# Patient Record
Sex: Male | Born: 1955
Health system: Southern US, Community
[De-identification: ages and names within clinical notes are randomized; demographics above are authoritative.]

## PROBLEM LIST (undated history)

## (undated) DIAGNOSIS — Z87891 Personal history of nicotine dependence: Secondary | ICD-10-CM

## (undated) DIAGNOSIS — J309 Allergic rhinitis, unspecified: Secondary | ICD-10-CM

## (undated) DIAGNOSIS — A539 Syphilis, unspecified: Secondary | ICD-10-CM

## (undated) DIAGNOSIS — K4090 Unilateral inguinal hernia, without obstruction or gangrene, not specified as recurrent: Secondary | ICD-10-CM

## (undated) DIAGNOSIS — Z8619 Personal history of other infectious and parasitic diseases: Secondary | ICD-10-CM

## (undated) HISTORY — DX: Personal history of other infectious and parasitic diseases: Z86.19

## (undated) HISTORY — DX: Allergic rhinitis, unspecified: J30.9

## (undated) HISTORY — DX: Personal history of nicotine dependence: Z87.891

## (undated) HISTORY — DX: Syphilis, unspecified: A53.9

---
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
## 1996-04-03 DIAGNOSIS — Z87891 Personal history of nicotine dependence: Secondary | ICD-10-CM

## 1996-04-03 HISTORY — DX: Personal history of nicotine dependence: Z87.891

## 2002-12-19 ENCOUNTER — Encounter: Payer: Self-pay | Admitting: Emergency Medicine

## 2002-12-19 ENCOUNTER — Emergency Department (HOSPITAL_COMMUNITY): Admission: EM | Admit: 2002-12-19 | Discharge: 2002-12-20 | Payer: Self-pay | Admitting: Emergency Medicine

## 2012-04-03 HISTORY — PX: DENTAL SURGERY: SHX609

## 2012-08-14 ENCOUNTER — Ambulatory Visit: Payer: Self-pay | Admitting: Family Medicine

## 2012-08-22 ENCOUNTER — Encounter: Payer: Self-pay | Admitting: Family Medicine

## 2012-10-09 ENCOUNTER — Ambulatory Visit (INDEPENDENT_AMBULATORY_CARE_PROVIDER_SITE_OTHER): Payer: 59 | Admitting: Family Medicine

## 2012-10-09 ENCOUNTER — Encounter: Payer: Self-pay | Admitting: Family Medicine

## 2012-10-09 VITALS — BP 122/82 | HR 64 | Temp 98.1°F | Ht 73.0 in | Wt 273.0 lb

## 2012-10-09 DIAGNOSIS — Z23 Encounter for immunization: Secondary | ICD-10-CM

## 2012-10-09 DIAGNOSIS — Z125 Encounter for screening for malignant neoplasm of prostate: Secondary | ICD-10-CM

## 2012-10-09 DIAGNOSIS — Z0001 Encounter for general adult medical examination with abnormal findings: Secondary | ICD-10-CM | POA: Insufficient documentation

## 2012-10-09 DIAGNOSIS — Z1211 Encounter for screening for malignant neoplasm of colon: Secondary | ICD-10-CM

## 2012-10-09 DIAGNOSIS — J019 Acute sinusitis, unspecified: Secondary | ICD-10-CM

## 2012-10-09 DIAGNOSIS — Z0000 Encounter for general adult medical examination without abnormal findings: Secondary | ICD-10-CM

## 2012-10-09 LAB — BASIC METABOLIC PANEL
BUN: 19 mg/dL (ref 6–23)
Calcium: 10.2 mg/dL (ref 8.4–10.5)
GFR: 93.01 mL/min (ref >60.00)
Potassium: 4.9 mEq/L (ref 3.5–5.1)
Sodium: 142 mEq/L (ref 135–145)

## 2012-10-09 LAB — LIPID PANEL
HDL: 54.8 mg/dL (ref >39.00)
Total CHOL/HDL Ratio: 3
Triglycerides: 82 mg/dL (ref 0.0–149.0)
VLDL: 16.4 mg/dL (ref 0.0–40.0)

## 2012-10-09 LAB — PSA: PSA: 0.48 ng/mL (ref 0.10–4.00)

## 2012-10-09 MED ORDER — AMOXICILLIN-POT CLAVULANATE 875-125 MG PO TABS: 1.0000 | ORAL_TABLET | Freq: Two times a day (BID) | ORAL | Status: AC

## 2012-10-09 NOTE — Assessment & Plan Note (Addendum)
Given duration of sxs, will treat with augmentin for possible persistent bacterial sinusitis Supportive care as per instructions

## 2012-10-09 NOTE — Assessment & Plan Note (Signed)
Preventative protocols reviewed and updated unless pt declined. Discussed healthy diet and lifestyle. Blood work today. iFOB and DRE/PSA today.

## 2012-10-09 NOTE — Progress Notes (Signed)
Subjective:    Patient ID: Jordan Reynolds, male    DOB: 08/28/1955, 57 y.o.   MRN: [id number]  HPI CC: new pt to establish  Several week h/o green discharge from nose, coughing clear mucous.  + HA and L ear pain. Using OTC meds such as pseudophed. Hoarse voice. Outdoor work.  Staying hydrated. No fevers/chills, abd pain  Preventative: No recent CPE or blood work Fasting today. Colon cancer screening - iFOB requested today. Prostate cancer screening - father with h/o prostate cancer - requests checked today. tetanus 2005 but unsure, requests today.  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  Past histories reviewed and updated if relevant as below. There are no active problems to display for this patient.  Past Medical History  Diagnosis Date  . History of chicken pox   . Allergic rhinitis   . Ex-smoker 1998   Past Surgical History  Procedure Laterality Date  . Tonsillectomy  1965  . Dental surgery  2014    implants   History  Substance Use Topics  . Smoking status: Former Games developer  . Smokeless tobacco: Never Used     Comment: quit October 1988  . Alcohol Use: No   Family History  Problem Relation Age of Onset  . Other Mother     benign brain tumor  . CAD Father     unsure, deceased  . Cancer Father 20    prostate  . Diabetes Mother     early  . Stroke Mother   . Hypertension Father   . Hyperlipidemia Mother    No Known Allergies No current outpatient prescriptions on file prior to visit.   No current facility-administered medications on file prior to visit.     Review of Systems  Constitutional: Negative for fever, chills, activity change, appetite change, fatigue and unexpected weight change (45 lb loss in 8-9 mo, trying - healthier  eating).  HENT: Positive for congestion, rhinorrhea, sneezing, voice change, postnasal drip and sinus pressure. Negative for hearing loss, sore throat and neck pain.   Eyes: Negative for visual disturbance.  Respiratory: Positive for cough. Negative for chest tightness, shortness of breath and wheezing.   Cardiovascular: Negative for chest pain, palpitations and leg swelling.  Gastrointestinal: Negative for nausea, vomiting, abdominal pain, diarrhea, constipation, blood in stool and abdominal distention.  Genitourinary: Negative for hematuria and difficulty urinating.  Musculoskeletal: Negative for myalgias and arthralgias.  Skin: Negative for rash.  Neurological: Negative for dizziness, seizures, syncope and headaches (sinusp ressure).  Hematological: Negative for adenopathy. Does not bruise/bleed easily.  Psychiatric/Behavioral: Negative for dysphoric mood. The patient is not nervous/anxious.        Objective:   Physical Exam  Nursing note and vitals reviewed. Constitutional: She is oriented to person, place, and time. She appears well-developed and well-nourished. No distress.  HENT:  Head: Normocephalic and atraumatic.  Right Ear: Hearing, tympanic membrane, external ear and ear canal normal.  Left Ear: Hearing, tympanic membrane, external ear and ear canal normal.  Nose: Nose normal. No mucosal edema or rhinorrhea. Right sinus exhibits no maxillary sinus tenderness and no frontal sinus tenderness. Left sinus exhibits no maxillary sinus tenderness and no frontal sinus tenderness.  Mouth/Throat: Uvula is midline, oropharynx is clear and moist and mucous membranes are normal. No oropharyngeal  exudate.  Eyes: Conjunctivae and EOM are normal. Pupils are equal, round, and reactive to light. No scleral icterus.  Neck: Normal range of motion. Neck supple. No JVD present. No thyromegaly present.  Cardiovascular: Normal rate, regular rhythm, normal heart sounds and intact distal pulses.   No  murmur heard. Pulses:      Radial pulses are 2+ on the right side, and 2+ on the left side.  Pulmonary/Chest: Effort normal and breath sounds normal. No respiratory distress. She has no wheezes. She has no rales.  Abdominal: Soft. Bowel sounds are normal. She exhibits no distension and no mass. There is no tenderness. There is no rebound and no guarding.  Genitourinary: Rectum normal. Rectal exam shows no external hemorrhoid, no internal hemorrhoid, no fissure, no mass, no tenderness and anal tone normal.  Prostate exam: smooth, not indurated, not enlarged, nontender. 20gm  Musculoskeletal: Normal range of motion. She exhibits no edema.  Lymphadenopathy:    She has no cervical adenopathy.  Neurological: She is alert and oriented to person, place, and time.  CN grossly intact, station and gait intact  Skin: Skin is warm and dry. No rash noted.  Psychiatric: She has a normal mood and affect. Her behavior is normal. Judgment and thought content normal.       Assessment & Plan:

## 2012-10-09 NOTE — Patient Instructions (Addendum)
Tdap today. Stool kit today - pass by lab. Blood work today. Good to meet you today, call us with quesitons.  You likely do have lingering sinus infection. Take medicine as prescribed: augmentin twice daily for 10 days Push fluids and plenty of rest. Nasal saline irrigation or neti pot to help drain sinuses. May use simple mucinex with plenty of fluid to help mobilize mucous. Let us know if fever >101.5, trouble opening/closing mouth, difficulty swallowing, or worsening - you may need to be seen again.

## 2012-10-09 NOTE — Addendum Note (Signed)
Addended by: Sydell Axon C on: 10/09/2012 11:34 AM   Modules accepted: Orders

## 2012-10-10 ENCOUNTER — Encounter: Payer: Self-pay | Admitting: *Deleted

## 2012-10-14 ENCOUNTER — Other Ambulatory Visit: Payer: 59

## 2012-10-14 ENCOUNTER — Encounter: Payer: Self-pay | Admitting: *Deleted

## 2012-10-14 DIAGNOSIS — Z1211 Encounter for screening for malignant neoplasm of colon: Secondary | ICD-10-CM

## 2013-09-23 ENCOUNTER — Other Ambulatory Visit: Payer: Self-pay | Admitting: Family Medicine

## 2013-09-23 DIAGNOSIS — Z0000 Encounter for general adult medical examination without abnormal findings: Secondary | ICD-10-CM

## 2013-09-23 DIAGNOSIS — Z125 Encounter for screening for malignant neoplasm of prostate: Secondary | ICD-10-CM

## 2013-09-26 ENCOUNTER — Other Ambulatory Visit (INDEPENDENT_AMBULATORY_CARE_PROVIDER_SITE_OTHER): Payer: 59

## 2013-09-26 DIAGNOSIS — Z125 Encounter for screening for malignant neoplasm of prostate: Secondary | ICD-10-CM

## 2013-09-26 DIAGNOSIS — Z0000 Encounter for general adult medical examination without abnormal findings: Secondary | ICD-10-CM

## 2013-09-26 LAB — LIPID PANEL
CHOLESTEROL: 173 mg/dL (ref 0–200)
HDL: 45.8 mg/dL (ref >39.00)
LDL Cholesterol: 115 mg/dL — ABNORMAL HIGH (ref 0–99)
NonHDL: 127.2
Total CHOL/HDL Ratio: 4
Triglycerides: 63 mg/dL (ref 0.0–149.0)
VLDL: 12.6 mg/dL (ref 0.0–40.0)

## 2013-09-26 LAB — PSA: PSA: 0.56 ng/mL (ref 0.10–4.00)

## 2013-09-26 LAB — BASIC METABOLIC PANEL
BUN: 22 mg/dL (ref 6–23)
CALCIUM: 9.1 mg/dL (ref 8.4–10.5)
CO2: 25 meq/L (ref 19–32)
Chloride: 108 mEq/L (ref 96–112)
Creatinine, Ser: 0.9 mg/dL (ref 0.4–1.5)
GFR: 91.51 mL/min (ref >60.00)
GLUCOSE: 105 mg/dL — AB (ref 70–99)
Potassium: 4.2 mEq/L (ref 3.5–5.1)
SODIUM: 138 meq/L (ref 135–145)

## 2013-10-10 ENCOUNTER — Encounter: Payer: Self-pay | Admitting: Family Medicine

## 2013-10-10 ENCOUNTER — Ambulatory Visit (INDEPENDENT_AMBULATORY_CARE_PROVIDER_SITE_OTHER): Payer: 59 | Admitting: Family Medicine

## 2013-10-10 VITALS — BP 126/80 | HR 84 | Temp 98.0°F | Ht 73.0 in | Wt 288.8 lb

## 2013-10-10 DIAGNOSIS — Z0000 Encounter for general adult medical examination without abnormal findings: Secondary | ICD-10-CM

## 2013-10-10 DIAGNOSIS — E669 Obesity, unspecified: Secondary | ICD-10-CM

## 2013-10-10 DIAGNOSIS — Z1211 Encounter for screening for malignant neoplasm of colon: Secondary | ICD-10-CM

## 2013-10-10 NOTE — Progress Notes (Signed)
Pre visit review using our clinic review tool, if applicable. No additional management support is needed unless otherwise documented below in the visit note. 

## 2013-10-10 NOTE — Progress Notes (Signed)
BP 126/80  Pulse 84  Temp(Src) 98 F (36.7 C) (Oral)  Ht 6\' 1"  (1.854 m)  Wt 288 lb 12 oz (130.976 kg)  BMI 38.10 kg/m2   CC: CPE  Subjective:    Patient ID: Jordan Reynolds, male    DOB: 05-06-1955, 58 y.o.   MRN: [id number]  HPI: KAJUAN GUYTON is a 58 y.o. male presenting on 10/10/2013 for Annual Exam   Wt Readings from Last 3 Encounters:  10/10/13 288 lb 12 oz (130.976 kg)  10/09/12 273 lb (123.832 kg)  Body mass index is 38.1 kg/(m^2).  Preventative: Colon cancer screening - iFOB requested today.  Prostate cancer screening - father with h/o prostate cancer - requests checked today.  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  Allergies and medications reviewed and updated. Current Outpatient Prescriptions on File Prior to Visit  Medication Sig  . Ascorbic Acid (VITAMIN C) 1000 MG tablet Take 1,000 mg by mouth daily.  . Multiple Vitamin (MULTIVITAMIN) tablet Take 1 tablet by mouth daily.  . vitamin B-12 (CYANOCOBALAMIN) 1000 MCG tablet Take 1,000 mcg by mouth daily.   No current facility-administered medications on file prior to visit.    Review of Systems  Constitutional: Negative for fever, chills, activity change, appetite change, fatigue and unexpected weight change.  HENT: Negative for hearing loss.   Eyes: Negative for visual disturbance.  Respiratory: Negative for cough, chest tightness, shortness of breath and wheezing.   Cardiovascular: Negative for chest pain, palpitations and leg swelling.  Gastrointestinal: Positive for blood in stool (occ with wiping). Negative for nausea, vomiting, abdominal  pain, diarrhea, constipation and abdominal distention.  Genitourinary: Negative for hematuria and difficulty urinating.  Musculoskeletal: Negative for arthralgias, myalgias and neck pain.  Skin: Negative for rash.  Neurological: Positive for headaches (frequent, possibly dehydration related). Negative for dizziness, seizures and syncope.  Hematological: Negative for adenopathy. Does not bruise/bleed easily.  Psychiatric/Behavioral: Negative for dysphoric mood. The patient is not nervous/anxious.    Per HPI unless specifically indicated above    Objective:    BP 126/80  Pulse 84  Temp(Src) 98 F (36.7 C) (Oral)  Ht 6\' 1"  (1.854 m)  Wt 288 lb 12 oz (130.976 kg)  BMI 38.10 kg/m2  Physical Exam  Nursing note and vitals reviewed. Constitutional: He is oriented to person, place, and time. He appears well-developed and well-nourished. No distress.  HENT:  Head: Normocephalic and atraumatic.  Right Ear: Hearing, tympanic membrane, external ear and ear canal normal.  Left Ear: Hearing, tympanic membrane, external ear and ear canal normal.  Nose: Nose normal.  Mouth/Throat: Uvula is midline, oropharynx is clear and moist and mucous membranes are normal. No oropharyngeal exudate, posterior oropharyngeal edema or posterior oropharyngeal erythema.  Eyes: Conjunctivae and EOM are normal. Pupils are equal, round, and reactive to light. No scleral icterus.  Neck: Normal range of motion. Neck supple. No thyromegaly present.  Cardiovascular: Normal rate, regular rhythm, normal heart sounds and intact distal pulses.   No murmur heard. Pulses:      Radial pulses are 2+ on the right side, and 2+ on the left  side.  Pulmonary/Chest: Effort normal and breath sounds normal. No respiratory distress. He has no wheezes. He has no rales.  Abdominal: Soft. Bowel sounds are normal. He exhibits no distension and no mass. There is no tenderness. There is no rebound and no guarding.  Genitourinary: Rectum normal  and prostate normal. Rectal exam shows no external hemorrhoid, no internal hemorrhoid, no fissure, no mass, no tenderness and anal tone normal. Prostate is not enlarged (15gm) and not tender.  Musculoskeletal: Normal range of motion. He exhibits no edema.  Lymphadenopathy:    He has no cervical adenopathy.  Neurological: He is alert and oriented to person, place, and time.  CN grossly intact, station and gait intact  Skin: Skin is warm and dry. No rash noted.  Psychiatric: He has a normal mood and affect. His behavior is normal. Judgment and thought content normal.   Results for orders placed in visit on 09/26/13  LIPID PANEL      Result Value Ref Range   Cholesterol 173  0 - 200 mg/dL   Triglycerides 63.0  0.0 - 149.0 mg/dL   HDL 45.80  >39.00 mg/dL   VLDL 12.6  0.0 - 40.0 mg/dL   LDL Cholesterol 115 (*) 0 - 99 mg/dL   Total CHOL/HDL Ratio 4     NonHDL 295.18    BASIC METABOLIC PANEL      Result Value Ref Range   Sodium 138  135 - 145 mEq/L   Potassium 4.2  3.5 - 5.1 mEq/L   Chloride 108  96 - 112 mEq/L   CO2 25  19 - 32 mEq/L   Glucose, Bld 105 (*) 70 - 99 mg/dL   BUN 22  6 - 23 mg/dL   Creatinine, Ser 0.9  0.4 - 1.5 mg/dL   Calcium 9.1  8.4 - 10.5 mg/dL   GFR 91.51  >60.00 mL/min  PSA      Result Value Ref Range   PSA 0.56  0.10 - 4.00 ng/mL      Assessment & Plan:   Problem List Items Addressed This Visit   Obesity     Body mass index is 38.1 kg/(m^2).  Reviewed healthy diet and lifestyle changes for sustainable weight loss. Encouraged prepared meals rather than convenience foods.    Healthcare maintenance - Primary     Preventative protocols reviewed and updated unless pt declined. Discussed healthy diet and lifestyle.     Other Visit Diagnoses   Special screening for malignant neoplasms, colon        Relevant Orders       Fecal occult blood, imunochemical        Follow up plan: Return in about 1 year (around 10/11/2014), or as needed, for annual exam,  prior fasting for blood work.

## 2013-10-10 NOTE — Assessment & Plan Note (Signed)
Preventative protocols reviewed and updated unless pt declined. Discussed healthy diet and lifestyle.  

## 2013-10-10 NOTE — Assessment & Plan Note (Addendum)
Body mass index is 38.1 kg/(m^2).  Reviewed healthy diet and lifestyle changes for sustainable weight loss. Encouraged prepared meals rather than convenience foods.

## 2013-10-10 NOTE — Patient Instructions (Addendum)
Stool kit today. Good to see you today, call us with questions. Work on [employer] for sustainable weight loss Return to see [place] in 1 year for next physical, sooner if needed.

## 2014-10-06 ENCOUNTER — Other Ambulatory Visit: Payer: Self-pay | Admitting: Family Medicine

## 2014-10-06 DIAGNOSIS — Z125 Encounter for screening for malignant neoplasm of prostate: Secondary | ICD-10-CM

## 2014-10-06 DIAGNOSIS — E669 Obesity, unspecified: Secondary | ICD-10-CM

## 2014-10-07 ENCOUNTER — Other Ambulatory Visit (INDEPENDENT_AMBULATORY_CARE_PROVIDER_SITE_OTHER): Payer: 59

## 2014-10-07 DIAGNOSIS — E669 Obesity, unspecified: Secondary | ICD-10-CM | POA: Diagnosis not present

## 2014-10-07 DIAGNOSIS — Z125 Encounter for screening for malignant neoplasm of prostate: Secondary | ICD-10-CM

## 2014-10-07 DIAGNOSIS — Z209 Contact with and (suspected) exposure to unspecified communicable disease: Secondary | ICD-10-CM

## 2014-10-07 LAB — COMPREHENSIVE METABOLIC PANEL
ALK PHOS: 61 U/L (ref 39–117)
ALT: 73 U/L — AB (ref 0–53)
AST: 48 U/L — ABNORMAL HIGH (ref 0–37)
Albumin: 3.9 g/dL (ref 3.5–5.2)
BILIRUBIN TOTAL: 0.7 mg/dL (ref 0.2–1.2)
BUN: 18 mg/dL (ref 6–23)
CO2: 26 mEq/L (ref 19–32)
CREATININE: 0.92 mg/dL (ref 0.40–1.50)
Calcium: 9.4 mg/dL (ref 8.4–10.5)
Chloride: 104 mEq/L (ref 96–112)
GFR: 90.03 mL/min (ref >60.00)
Glucose, Bld: 102 mg/dL — ABNORMAL HIGH (ref 70–99)
Potassium: 4.7 mEq/L (ref 3.5–5.1)
SODIUM: 140 meq/L (ref 135–145)
TOTAL PROTEIN: 6.8 g/dL (ref 6.0–8.3)

## 2014-10-07 LAB — LIPID PANEL
CHOL/HDL RATIO: 4
Cholesterol: 161 mg/dL (ref 0–200)
HDL: 36 mg/dL — ABNORMAL LOW (ref >39.00)
LDL Cholesterol: 108 mg/dL — ABNORMAL HIGH (ref 0–99)
NonHDL: 125
TRIGLYCERIDES: 87 mg/dL (ref 0.0–149.0)
VLDL: 17.4 mg/dL (ref 0.0–40.0)

## 2014-10-07 LAB — PSA: PSA: 0.57 ng/mL (ref 0.10–4.00)

## 2014-10-07 NOTE — Addendum Note (Signed)
Addended by: Daralene Milch C on: 10/07/2014 01:07 PM   Modules accepted: Orders

## 2014-10-08 LAB — RPR TITER

## 2014-10-08 LAB — GC/CHLAMYDIA PROBE AMP, URINE
Chlamydia, Swab/Urine, PCR: NEGATIVE
GC Probe Amp, Urine: NEGATIVE

## 2014-10-08 LAB — HSV(HERPES SIMPLEX VRS) I + II AB-IGG
HSV 1 Glycoprotein G Ab, IgG: 8.28 IV — ABNORMAL HIGH
HSV 2 Glycoprotein G Ab, IgG: 0.18 IV

## 2014-10-08 LAB — HIV ANTIBODY (ROUTINE TESTING W REFLEX): HIV: NONREACTIVE

## 2014-10-08 LAB — FLUORESCENT TREPONEMAL AB(FTA)-IGG-BLD: Fluorescent Treponemal ABS: REACTIVE — AB

## 2014-10-08 LAB — RPR: RPR Ser Ql: REACTIVE — AB

## 2014-10-08 LAB — HSV(HERPES SIMPLEX VRS) I + II AB-IGM: Herpes Simplex Vrs I&II-IgM Ab (EIA): 0.84 INDEX

## 2014-10-13 ENCOUNTER — Encounter: Payer: Self-pay | Admitting: Family Medicine

## 2014-10-13 ENCOUNTER — Ambulatory Visit (INDEPENDENT_AMBULATORY_CARE_PROVIDER_SITE_OTHER): Payer: 59 | Admitting: Family Medicine

## 2014-10-13 ENCOUNTER — Ambulatory Visit (INDEPENDENT_AMBULATORY_CARE_PROVIDER_SITE_OTHER)
Admission: RE | Admit: 2014-10-13 | Discharge: 2014-10-13 | Disposition: A | Discharge status: Home / Self Care | Payer: 59 | Source: Ambulatory Visit | Attending: Family Medicine | Admitting: Family Medicine

## 2014-10-13 VITALS — BP 122/64 | HR 96 | Temp 98.1°F | Ht 74.0 in | Wt 277.4 lb

## 2014-10-13 DIAGNOSIS — Z1211 Encounter for screening for malignant neoplasm of colon: Secondary | ICD-10-CM

## 2014-10-13 DIAGNOSIS — R7401 Elevation of levels of liver transaminase levels: Secondary | ICD-10-CM

## 2014-10-13 DIAGNOSIS — R059 Cough, unspecified: Secondary | ICD-10-CM

## 2014-10-13 DIAGNOSIS — A539 Syphilis, unspecified: Secondary | ICD-10-CM

## 2014-10-13 DIAGNOSIS — R05 Cough: Secondary | ICD-10-CM | POA: Insufficient documentation

## 2014-10-13 DIAGNOSIS — A510 Primary genital syphilis: Secondary | ICD-10-CM | POA: Insufficient documentation

## 2014-10-13 DIAGNOSIS — E669 Obesity, unspecified: Secondary | ICD-10-CM

## 2014-10-13 DIAGNOSIS — R740 Nonspecific elevation of levels of transaminase and lactic acid dehydrogenase [LDH]: Secondary | ICD-10-CM | POA: Diagnosis not present

## 2014-10-13 DIAGNOSIS — Z0000 Encounter for general adult medical examination without abnormal findings: Secondary | ICD-10-CM | POA: Diagnosis not present

## 2014-10-13 HISTORY — DX: Syphilis, unspecified: A53.9

## 2014-10-13 MED ORDER — PENICILLIN G BENZATHINE 1200000 UNIT/2ML IM SUSP
2.4000 10*6.[IU] | Freq: Once | INTRAMUSCULAR | Status: AC
  Administered 2014-10-13: 2.4 10*6.[IU] via INTRAMUSCULAR

## 2014-10-13 NOTE — Assessment & Plan Note (Signed)
Discussed healthy diet and lifestyle changes to affect sustainable weight loss. Body mass index is 35.6 kg/(m^2).

## 2014-10-13 NOTE — Addendum Note (Signed)
Addended byCloyd Stagers B on: 10/13/2014 12:52 PM   Modules accepted: Orders

## 2014-10-13 NOTE — Assessment & Plan Note (Signed)
New, coarse breath sounds RUQ.  Check CXR today.  Ex smoker

## 2014-10-13 NOTE — Assessment & Plan Note (Addendum)
Discussed with patient. Pt denies recent infidelity. 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 Discussed incubation period usually <1 mo so unlikely to have been exposed remotely.  Treat with 2.21mil u PCN shot today in office.  Advised he get wife evaluated for syphilis.  Pt agrees with plan.

## 2014-10-13 NOTE — Assessment & Plan Note (Signed)
Preventative protocols reviewed and updated unless pt declined. Discussed healthy diet and lifestyle.  

## 2014-10-13 NOTE — Progress Notes (Addendum)
BP 122/64 mmHg  Pulse 96  Temp(Src) 98.1 F (36.7 C) (Oral)  Ht 6\' 2"  (1.88 m)  Wt 277 lb 6.4 oz (125.828 kg)  BMI 35.60 kg/m2   CC: CPE  Subjective:    Patient ID: Jordan Reynolds, male    DOB: May 26, 1955, 59 y.o.   MRN: [id number]  HPI: Jordan Reynolds is a 59 y.o. male presenting on 10/13/2014 for Annual Exam   Recently requested full STD testing - and tested positive for syphilis with titers 1:64. No known prior treatment. 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 End of May had boil on penis, seen at [facility] and treated with podofilox soln - caused worsening of lesion. Now developed hard lump at area, painless but itchy.   No confusion, headaches, no cardiac symptoms (heart palpitations or chest pain). No fevers/chills, LAD, rash.   Also - dry cough present for last 5 days. No fevers, malaise. Feels short winded at times.  Preventative: Colon cancer screening - iFOB requested today. Forgot to submit last year. Requests rpt today. Prostate cancer screening - father with h/o prostate cancer - requests checked today. Flu - doesn't receive Td 2009, Tdap 10/2012 Sunscreen use discussed, does not use. No changing moles. 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 Interim medical history since our last visit reviewed. Allergies and medications reviewed and updated. Current Outpatient Prescriptions on File Prior to Visit  Medication Sig  . Ascorbic Acid (VITAMIN C) 1000 MG tablet Take 1,000 mg by mouth daily.  . Multiple Vitamin (MULTIVITAMIN) tablet Take 1 tablet by mouth daily.  . vitamin B-12 (CYANOCOBALAMIN) 1000 MCG tablet Take 1,000 mcg by mouth daily.   No current facility-administered medications on file  prior to visit.    Review of Systems  Constitutional: Negative for fever, chills, activity change, appetite change, fatigue and unexpected weight change.  HENT: Negative for hearing loss.   Eyes: Positive for visual disturbance (to see eye doctor next month).  Respiratory: Negative for cough, chest tightness, shortness of breath and wheezing.   Cardiovascular: Negative for chest pain, palpitations and leg swelling.  Gastrointestinal: Negative for nausea, vomiting, abdominal pain, diarrhea, constipation, blood in stool and abdominal distention.  Genitourinary: Positive for genital sores. Negative for hematuria and difficulty urinating.  Musculoskeletal: Negative for myalgias, arthralgias and neck pain.  Skin: Negative for rash.  Neurological: Negative for dizziness, seizures, syncope and headaches.  Hematological: Negative for adenopathy. Does not bruise/bleed easily.  Psychiatric/Behavioral: Negative for dysphoric mood. The patient is not nervous/anxious.    Per HPI unless specifically indicated above     Objective:    BP 122/64 mmHg  Pulse 96  Temp(Src) 98.1 F (36.7 C) (Oral)  Ht 6\' 2"  (1.88 m)  Wt 277 lb 6.4 oz (125.828 kg)  BMI 35.60 kg/m2  Wt Readings from Last 3 Encounters:  10/13/14 277 lb 6.4 oz (125.828 kg)  10/10/13 288 lb 12 oz (130.976 kg)  10/09/12 273 lb (123.832 kg)    Physical Exam  Constitutional: He is oriented to person, place, and time. He appears well-developed and well-nourished. No distress.  HENT:  Head: Normocephalic and atraumatic.  Right Ear: Hearing, tympanic membrane, external ear and ear canal normal.  Left Ear: Hearing, tympanic membrane, external ear and ear canal normal.  Nose: Nose normal.  Mouth/Throat: Uvula is midline, oropharynx is clear and moist and mucous membranes are normal. No oropharyngeal exudate, posterior oropharyngeal edema or posterior oropharyngeal erythema.  Eyes: Conjunctivae and EOM are normal. Pupils are equal, round,  and reactive to light. No scleral icterus.  Neck: Normal range of motion. Neck supple. No thyromegaly present.  Cardiovascular: Normal rate, regular rhythm, normal heart sounds and intact distal pulses.   No murmur heard. Pulses:      Radial pulses are 2+ on the right side, and 2+ on the left side.  Pulmonary/Chest: Effort normal. No respiratory distress. He has no wheezes. He has rhonchi (RUL coarse breath sounds). He has no rales.  Abdominal: Soft. Bowel sounds are normal. He exhibits no distension and no mass. There is no tenderness. There is no rebound and no guarding.  Genitourinary: Rectum normal, prostate normal and testes normal. Rectal exam shows no external hemorrhoid, no internal hemorrhoid, no fissure, no mass, no tenderness and anal tone normal.    Prostate is not enlarged (15gm) and not tender. No penile tenderness. No discharge found.  Painless indurated chancre L penile shaft  Musculoskeletal: Normal range of motion. He exhibits no edema.  Lymphadenopathy:    He has no cervical adenopathy.  Neurological: He is alert and oriented to person, place, and time.  CN grossly intact, station and gait intact  Skin: Skin is warm and dry. No rash noted.  Psychiatric: He has a normal mood and affect. His behavior is normal. Judgment and thought content normal.  Nursing note and vitals reviewed.  Results for orders placed or performed in visit on 10/07/14  Comprehensive metabolic panel  Result Value Ref Range   Sodium 140 135 - 145 mEq/L   Potassium 4.7 3.5 - 5.1 mEq/L   Chloride 104 96 - 112 mEq/L   CO2 26 19 - 32 mEq/L   Glucose, Bld 102 (H) 70 - 99 mg/dL   BUN 18 6 - 23 mg/dL   Creatinine, Ser 0.92 0.40 - 1.50 mg/dL   Total Bilirubin 0.7 0.2 - 1.2 mg/dL   Alkaline Phosphatase 61 39 - 117 U/L   AST 48 (H) 0 - 37 U/L   ALT 73 (H) 0 - 53 U/L   Total Protein 6.8 6.0 - 8.3 g/dL   Albumin 3.9 3.5 - 5.2 g/dL   Calcium 9.4 8.4 - 10.5 mg/dL   GFR 90.03 >60.00 mL/min  PSA  Result  Value Ref Range   PSA 0.57 0.10 - 4.00 ng/mL  Lipid panel  Result Value Ref Range   Cholesterol 161 0 - 200 mg/dL   Triglycerides 87.0 0.0 - 149.0 mg/dL   HDL 36.00 (L) >39.00 mg/dL   VLDL 17.4 0.0 - 40.0 mg/dL   LDL Cholesterol 108 (H) 0 - 99 mg/dL   Total CHOL/HDL Ratio 4    NonHDL 125.00   GC/chlamydia probe amp, urine  Result Value Ref Range   Chlamydia, Swab/Urine, PCR NEGATIVE NEGATIVE   GC Probe Amp, Urine NEGATIVE NEGATIVE  HIV antibody  Result Value Ref Range   HIV 1&2 Ab, 4th Generation NONREACTIVE NONREACTIVE  RPR  Result Value Ref Range   RPR Ser Ql REACTIVE (A) NON REAC  HSV(herpes simplex vrs) 1+2 ab-IgG  Result Value Ref Range   HSV 1 Glycoprotein G Ab, IgG 8.28 (H) IV   HSV 2 Glycoprotein G Ab, IgG 0.18 IV  HSV(herpes simplex vrs) 1+2 ab-IgM  Result Value Ref Range   Herpes Simplex Vrs I&II-IgM Ab (EIA) 0.84 INDEX  Fluorescent treponemal ab(fta)-IgG-bld  Result Value Ref Range   Fluorescent Treponemal ABS REACTIVE (A)   Rpr titer  Result Value Ref Range   RPR Titer 1:64 (A)       Assessment & Plan:  RTC 3 mo for lab visit only for recheck RPR and further transaminitis eval Problem List Items Addressed This Visit    Cough    New, coarse breath sounds RUQ.  Check CXR today.  Ex smoker       Relevant Orders   DG Chest 2 View   Healthcare maintenance - Primary    Preventative protocols reviewed and updated unless pt declined. Discussed healthy diet and lifestyle.       Obesity    Discussed healthy diet and lifestyle changes to affect sustainable weight loss. Body mass index is 35.6 kg/(m^2).       Syphilis    Discussed with patient. Pt denies recent infidelity. 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 Discussed incubation period usually >1 mo so unlikely to have been exposed remotely.  Treat with 2.65mil u PCN shot today in office.  Advised he get wife evaluated for syphilis.  Pt agrees with plan.      Relevant Orders   RPR      Other Visit Diagnoses    Special screening for malignant neoplasms, colon        Relevant Orders    Fecal occult blood, imunochemical    Transaminitis        Relevant Orders    Hepatic function panel    IBC panel    TSH    Hepatitis panel, acute       CORRECTION ==> "incubation period <24mo" Follow up plan: Return in about 1 year (around 10/13/2015), or as needed, for annual exam, prior fasting for blood work.

## 2014-10-13 NOTE — Progress Notes (Signed)
Pre visit review using our clinic review tool, if applicable. No additional management support is needed unless otherwise documented below in the visit note. 

## 2014-10-13 NOTE — Patient Instructions (Addendum)
Pass by lab to pick up stool kit today. PCN 2.4 mil units shot today.  Return in 2-3 months for lab visit only to recheck liver function. cxr today.  Syphilis Syphilis is an infectious disease. It can cause serious complications if left untreated.  CAUSES  Syphilis is caused by a type of bacteria called Treponema pallidum. It is most commonly spread through sexual contact. Syphilis may also spread to a fetus through the blood of the mother.  SIGNS AND SYMPTOMS Symptoms vary depending on the stage of the disease. Some symptoms may disappear without treatment. However, this does not mean that the infection is gone. One form of syphilis (called latent syphilis) has no symptoms.  Primary Syphilis  Painless sores (chancres) in and around the genital organs and mouth.  Swollen lymph nodes near the sores. Secondary Syphilis  A rash or sores over any portion of the body, including the palms of the hands and soles of the feet.  Fever.  Headache.  Sore throat.  Swollen lymph nodes.  New sores in the mouth or on the genitals.  Feeling generally ill.  Having pain in the joints. Tertiary Syphilis The third stage of syphilis involves severe damage to different organs in the body, such as the brain, spinal cord, and heart. Signs and symptoms may include:   Dementia.  Personality and mood changes.  Difficulty walking.  Heart failure.  Fainting.  Enlargement (aneurysm) of the aorta.  Tumors of the skin, bones, or liver.  Muscle weakness.  Sudden "lightning" pains, numbness, or tingling.  Problems with coordination.  Vision changes. DIAGNOSIS   A physical exam will be done.  Blood tests will be done to confirm the diagnosis.  If the disease is in the first or second stages, a fluid (drainage) sample from a sore or rash may be examined under a microscope to detect the disease-causing bacteria.  Fluid around the spine may need to be examined to detect brain damage or  inflammation of the brain lining (meningitis).  If the disease is in the third stage, X-rays, CT scans, MRIs, echocardiograms, ultrasounds, or cardiac catheterization may also be done to detect disease of the heart, aorta, or brain. TREATMENT  Syphilis can be cured with antibiotic medicine if a diagnosis is made early. During the first day of treatment, you may experience fever, chills, headache, nausea, or aching all over your body. This is a normal reaction to the antibiotics.  HOME CARE INSTRUCTIONS   Take your antibiotic medicine as directed by your health care provider. Finish the antibiotic even if you start to feel better. Incomplete treatment will put you at risk for continued infection and could be life threatening.  Take medicines only as directed by your health care provider.  Do not have sexual intercourse until your treatment is completed or as directed by your health care provider.  Inform your recent sexual partners that you were diagnosed with syphilis. They need to seek care and treatment, even if they have no symptoms. It is necessary that all your sexual partners be tested for infection and treated if they have the disease.  Keep all follow-up visits as directed by your health care provider. It is important to keep all your appointments.  If your test results are not ready during your visit, make an appointment with your health care provider to find out the results. Do not assume everything is normal if you have not heard from your health care provider or the medical facility. It is your  responsibility to get your test results. SEEK MEDICAL CARE IF:  You continue to have any of the following 24 hours after beginning treatment:  Fever.  Chills.  Headache.  Nausea.  Aching all over your body.  You have symptoms of an allergic reaction to medicine, such as:  Chills.  A headache.  Light-headedness.  A new rash (especially hives).  Difficulty breathing. MAKE  SURE YOU:   Understand these instructions.  Will watch your condition.  Will get help right away if you are not doing well or get worse. Document Released: 01/08/2013 Document Revised: 08/04/2013 Document Reviewed: 01/08/2013 [facility] Patient Information 2015 [place], [place]. This information is not intended to replace advice given to you by your health care provider. Make sure you discuss any questions you have with your health care provider.

## 2014-10-14 ENCOUNTER — Encounter: Payer: 59 | Admitting: Family Medicine

## 2014-10-20 ENCOUNTER — Other Ambulatory Visit: Payer: 59

## 2014-10-20 DIAGNOSIS — Z1211 Encounter for screening for malignant neoplasm of colon: Secondary | ICD-10-CM

## 2014-10-20 LAB — FECAL OCCULT BLOOD, IMMUNOCHEMICAL: Fecal Occult Bld: NEGATIVE

## 2014-11-02 ENCOUNTER — Telehealth: Payer: Self-pay

## 2014-11-02 NOTE — Telephone Encounter (Signed)
Dannielle Burn with [facility] called to f/u on positive RPR; wanted to know if pt and pts wife was treated; advised pt was treated with Bicillin LA 2.4 million U IM on 10/13/14. Georgina Snell wanted to know if pts wife, Lattie Haw had been tested. I could not advise that since Lattie Haw is not pt at [facility]. Georgina Snell wanted to know if HIV was done and advised yes and results non reactive. Sent to Dr Darnell Level and Maudie Mercury CMA as Juluis Rainier.

## 2014-11-02 NOTE — Telephone Encounter (Signed)
Noted. Agree. Advised he get wife tested at office visit.

## 2014-11-11 ENCOUNTER — Telehealth: Payer: Self-pay

## 2014-11-11 DIAGNOSIS — A539 Syphilis, unspecified: Secondary | ICD-10-CM

## 2014-11-11 NOTE — Telephone Encounter (Signed)
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 Advised to have wife tested. Let us know if they do recommend re-testing after treatment (usually at 6 mo interval).

## 2014-11-11 NOTE — Telephone Encounter (Signed)
[place] health advisor for state health dept left v/m; pt had reactive RPR result on 10/07/2014; pt has only received one treatment. Tiara wants to know if pt is diagnosed as primary stage of syphilis and if so did pt have signs or symptoms during physical exam pt had. Has pt ever had any prior testing to the 10/07/2014 lab. Tiara request cb.

## 2014-11-12 NOTE — Telephone Encounter (Signed)
Tiara notified. She said as far as re-testing, the CDC recommends it 3-6 months after treatment to make sure it has cleared, but it is not required.

## 2014-11-13 NOTE — Telephone Encounter (Signed)
Will check at upcoming lab appt.

## 2014-11-25 ENCOUNTER — Telehealth: Payer: Self-pay

## 2014-11-25 NOTE — Telephone Encounter (Signed)
[place] health advisor for state health dept request HIV result to close out health dept report. Advised HIV test done on 10/07/2014 was nonreactive.Tiara voiced understanding.

## 2015-01-04 ENCOUNTER — Other Ambulatory Visit (INDEPENDENT_AMBULATORY_CARE_PROVIDER_SITE_OTHER): Payer: 59

## 2015-01-04 DIAGNOSIS — R740 Nonspecific elevation of levels of transaminase and lactic acid dehydrogenase [LDH]: Secondary | ICD-10-CM

## 2015-01-04 DIAGNOSIS — A539 Syphilis, unspecified: Secondary | ICD-10-CM

## 2015-01-04 DIAGNOSIS — R7401 Elevation of levels of liver transaminase levels: Secondary | ICD-10-CM

## 2015-01-04 LAB — HEPATIC FUNCTION PANEL
ALT: 40 U/L (ref 0–53)
AST: 33 U/L (ref 0–37)
Albumin: 4.5 g/dL (ref 3.5–5.2)
Alkaline Phosphatase: 47 U/L (ref 39–117)
BILIRUBIN DIRECT: 0.2 mg/dL (ref 0.0–0.3)
Total Bilirubin: 1.1 mg/dL (ref 0.2–1.2)
Total Protein: 7.1 g/dL (ref 6.0–8.3)

## 2015-01-04 LAB — IBC PANEL
Iron: 204 ug/dL — ABNORMAL HIGH (ref 42–165)
SATURATION RATIOS: 50.9 % — AB (ref 20.0–50.0)
Transferrin: 286 mg/dL (ref 212.0–360.0)

## 2015-01-04 LAB — TSH: TSH: 1.74 u[IU]/mL (ref 0.35–4.50)

## 2015-01-05 LAB — HEPATITIS PANEL, ACUTE
HCV Ab: NEGATIVE
HEP B S AG: NEGATIVE
Hep A IgM: NONREACTIVE
Hep B C IgM: NONREACTIVE

## 2015-01-05 LAB — RPR

## 2015-01-07 NOTE — Addendum Note (Signed)
Addended by: Ria Bush on: 01/07/2015 04:51 PM   Modules accepted: Orders, SmartSet

## 2015-01-11 ENCOUNTER — Telehealth: Payer: Self-pay | Admitting: Family Medicine

## 2015-01-11 DIAGNOSIS — Z1211 Encounter for screening for malignant neoplasm of colon: Secondary | ICD-10-CM

## 2015-01-11 NOTE — Telephone Encounter (Signed)
Pt would like a referral for colonoscopy.  He has never had one cb number [phone]

## 2015-01-11 NOTE — Telephone Encounter (Signed)
Referral placed.

## 2015-01-13 ENCOUNTER — Encounter: Payer: Self-pay | Admitting: Gastroenterology

## 2015-02-22 ENCOUNTER — Ambulatory Visit (AMBULATORY_SURGERY_CENTER): Payer: Self-pay

## 2015-02-22 VITALS — Ht 73.5 in | Wt 261.0 lb

## 2015-02-22 DIAGNOSIS — Z1211 Encounter for screening for malignant neoplasm of colon: Secondary | ICD-10-CM

## 2015-02-22 MED ORDER — SUPREP BOWEL PREP KIT 17.5-3.13-1.6 GM/177ML PO SOLN: 1.0000 | Freq: Once | ORAL | Status: DC

## 2015-02-22 NOTE — Progress Notes (Signed)
No allergies to eggs or soy No past problems with anesthesia No home oxygen No diet/weight loss meds  Has email and internet; registered for emmi 

## 2015-03-04 ENCOUNTER — Encounter: Payer: Self-pay | Admitting: Gastroenterology

## 2015-03-04 HISTORY — PX: COLONOSCOPY: SHX174

## 2015-03-12 ENCOUNTER — Encounter: Payer: Self-pay | Admitting: Gastroenterology

## 2015-03-12 ENCOUNTER — Ambulatory Visit (AMBULATORY_SURGERY_CENTER): Payer: 59 | Admitting: Gastroenterology

## 2015-03-12 VITALS — BP 127/80 | HR 64 | Temp 97.6°F | Resp 18 | Ht 73.5 in | Wt 261.0 lb

## 2015-03-12 DIAGNOSIS — D124 Benign neoplasm of descending colon: Secondary | ICD-10-CM

## 2015-03-12 DIAGNOSIS — Z1211 Encounter for screening for malignant neoplasm of colon: Secondary | ICD-10-CM | POA: Diagnosis present

## 2015-03-12 DIAGNOSIS — D123 Benign neoplasm of transverse colon: Secondary | ICD-10-CM

## 2015-03-12 MED ORDER — SODIUM CHLORIDE 0.9 % IV SOLN: 500.0000 mL | INTRAVENOUS | Status: DC

## 2015-03-12 NOTE — Patient Instructions (Signed)
YOU HAD AN ENDOSCOPIC PROCEDURE TODAY AT THE [facility]:   Refer to the procedure report that was given to you for any specific questions about what was found during the examination.  If the procedure report does not answer your questions, please call your gastroenterologist to clarify.  If you requested that your care partner not be given the details of your procedure findings, then the procedure report has been included in a sealed envelope for you to review at your convenience later.  YOU SHOULD EXPECT: Some feelings of bloating in the abdomen. Passage of more gas than usual.  Walking can help get rid of the air that was put into your GI tract during the procedure and reduce the bloating. If you had a lower endoscopy (such as a colonoscopy or flexible sigmoidoscopy) you may notice spotting of blood in your stool or on the toilet paper. If you underwent a bowel prep for your procedure, you may not have a normal bowel movement for a few days.  Please Note:  You might notice some irritation and congestion in your nose or some drainage.  This is from the oxygen used during your procedure.  There is no need for concern and it should clear up in a day or so.  SYMPTOMS TO REPORT IMMEDIATELY:   Following lower endoscopy (colonoscopy or flexible sigmoidoscopy):  Excessive amounts of blood in the stool  Significant tenderness or worsening of abdominal pains  Swelling of the abdomen that is new, acute  Fever of 100F or higher  For urgent or emergent issues, a gastroenterologist can be reached at any hour by calling [phone].  DIET: Your first meal following the procedure should be a small meal and then it is ok to progress to your normal diet. Heavy or fried foods are harder to digest and may make you feel nauseous or bloated.  Likewise, meals heavy in dairy and vegetables can increase bloating.  Drink plenty of fluids but you should avoid alcoholic beverages for 24 hours.  ACTIVITY:   You should plan to take it easy for the rest of today and you should NOT DRIVE or use heavy machinery until tomorrow (because of the sedation medicines used during the test).    FOLLOW UP: Our staff will call the number listed on your records the next business day following your procedure to check on you and address any questions or concerns that you may have regarding the information given to you following your procedure. If we do not reach you, we will leave a message.  However, if you are feeling well and you are not experiencing any problems, there is no need to return our call.  We will assume that you have returned to your regular daily activities without incident.  If any biopsies were taken you will be contacted by phone or by letter within the next 1-3 weeks.  Please call us at [phone] if you have not heard about the biopsies in 3 weeks.   SIGNATURES/CONFIDENTIALITY: You and/or your care partner have signed paperwork which will be entered into your electronic medical record.  These signatures attest to the fact that that the information above on your After Visit Summary has been reviewed and is understood.  Full responsibility of the confidentiality of this discharge information lies with you and/or your care-partner.  Please continue your normal medications  Please read over handout about polyps 

## 2015-03-12 NOTE — Op Note (Signed)
[place]  [address] [place], [id number]   COLONOSCOPY PROCEDURE REPORT  PATIENT: Jordan Reynolds, Jordan Reynolds  MR#: [id number] BIRTHDATE: [birth year] , 21  yrs. old GENDER: male ENDOSCOPIST: Milus Banister, MD REFERRED [id number] Gutierrez, M.D. PROCEDURE DATE:  03/12/2015 PROCEDURE:   Colonoscopy, screening and Colonoscopy with snare polypectomy First Screening Colonoscopy - Avg.  risk and is 50 yrs.  old or older Yes.  Prior Negative Screening - Now for repeat screening. N/A  History of Adenoma - Now for follow-up colonoscopy & has been > or = to 3 yrs.  N/A  Polyps removed today? Yes ASA CLASS:   Class II INDICATIONS:Screening for colonic neoplasia and Colorectal Neoplasm Risk Assessment for this procedure is average risk. MEDICATIONS: Monitored anesthesia care and Propofol 300 mg IV  DESCRIPTION OF PROCEDURE:   After the risks benefits and alternatives of the procedure were thoroughly explained, informed consent was obtained.  The digital rectal exam revealed no abnormalities of the rectum.   The LB [id number]  endoscope was introduced through the anus and advanced to the cecum, which was identified by both the appendix and ileocecal valve. No adverse events experienced.   The quality of the prep was good.  The instrument was then slowly withdrawn as the colon was fully examined. Estimated blood loss is zero unless otherwise noted in this procedure report.   COLON FINDINGS: Three sessile polyps ranging between 3-23mm in size were found in the descending colon and transverse colon. Polypectomies were performed with a cold snare.  The resection was complete, the polyp tissue was completely retrieved and sent to histology.   The examination was otherwise normal.  Retroflexed views revealed no abnormalities. The time to cecum = 3.0 Withdrawal time = 11.6   The scope was withdrawn and the procedure completed. COMPLICATIONS: There were no immediate  complications.  ENDOSCOPIC IMPRESSION: 1.  Three sessile polyps ranging between 3-69mm in size were found in the descending colon and transverse colon; polypectomies were performed with a cold snare 2.   The examination was otherwise normal  RECOMMENDATIONS: If the polyp(s) removed today are proven to be adenomatous (pre-cancerous) polyps, you will need a colonoscopy in 3-5 years. Otherwise you should continue to follow colorectal cancer screening guidelines for "routine risk" patients with a colonoscopy in 10 years.  You will receive a letter within 1-2 weeks with the results of your biopsy as well as final recommendations.  Please call my office if you have not received a letter after 3 weeks.  eSigned:  Milus Banister, MD 03/12/2015 1:49 PM

## 2015-03-12 NOTE — Progress Notes (Signed)
Called to room to assist during endoscopic procedure.  Patient ID and intended procedure confirmed with present staff. Received instructions for my participation in the procedure from the performing physician.  

## 2015-03-12 NOTE — Progress Notes (Signed)
To recovery report to [facility], Therapist, sports, VSS.

## 2015-03-15 ENCOUNTER — Telehealth: Payer: Self-pay

## 2015-03-15 ENCOUNTER — Telehealth: Payer: Self-pay | Admitting: Gastroenterology

## 2015-03-15 NOTE — Telephone Encounter (Signed)
Pt has been notified by message that his recall colon will depend on the path result.  This is not available as of today.  He was advised he would receive a letter or a phone call once the path has been reviewed

## 2015-03-15 NOTE — Telephone Encounter (Signed)
Left a message for the pt on his answering machine to call us back if any questions or concerns at [phone]. maw

## 2015-03-17 ENCOUNTER — Encounter: Payer: Self-pay | Admitting: Gastroenterology

## 2015-03-18 ENCOUNTER — Encounter: Payer: Self-pay | Admitting: Family Medicine

## 2015-04-15 ENCOUNTER — Other Ambulatory Visit (INDEPENDENT_AMBULATORY_CARE_PROVIDER_SITE_OTHER): Payer: 59

## 2015-04-15 DIAGNOSIS — R740 Nonspecific elevation of levels of transaminase and lactic acid dehydrogenase [LDH]: Secondary | ICD-10-CM | POA: Diagnosis not present

## 2015-04-15 DIAGNOSIS — R7401 Elevation of levels of liver transaminase levels: Secondary | ICD-10-CM

## 2015-04-15 LAB — FERRITIN: Ferritin: 67.2 ng/mL (ref 22.0–322.0)

## 2015-04-15 LAB — HEPATIC FUNCTION PANEL
ALBUMIN: 4.1 g/dL (ref 3.5–5.2)
ALT: 26 U/L (ref 0–53)
AST: 20 U/L (ref 0–37)
Alkaline Phosphatase: 47 U/L (ref 39–117)
Bilirubin, Direct: 0.1 mg/dL (ref 0.0–0.3)
TOTAL PROTEIN: 6.4 g/dL (ref 6.0–8.3)
Total Bilirubin: 0.5 mg/dL (ref 0.2–1.2)

## 2015-04-15 LAB — IBC PANEL
IRON: 84 ug/dL (ref 42–165)
Saturation Ratios: 21.5 % (ref 20.0–50.0)
TRANSFERRIN: 279 mg/dL (ref 212.0–360.0)

## 2015-04-19 ENCOUNTER — Encounter: Payer: Self-pay | Admitting: *Deleted

## 2015-10-08 ENCOUNTER — Other Ambulatory Visit: Payer: Self-pay

## 2015-10-10 ENCOUNTER — Encounter: Payer: Self-pay | Admitting: Family Medicine

## 2015-10-10 ENCOUNTER — Other Ambulatory Visit: Payer: Self-pay | Admitting: Family Medicine

## 2015-10-10 DIAGNOSIS — E669 Obesity, unspecified: Secondary | ICD-10-CM

## 2015-10-10 DIAGNOSIS — A510 Primary genital syphilis: Secondary | ICD-10-CM

## 2015-10-10 DIAGNOSIS — Z125 Encounter for screening for malignant neoplasm of prostate: Secondary | ICD-10-CM

## 2015-10-10 NOTE — Assessment & Plan Note (Signed)
Primary syphilis treated with 2.35mil U PCN IM  10/2015

## 2015-10-11 ENCOUNTER — Other Ambulatory Visit (INDEPENDENT_AMBULATORY_CARE_PROVIDER_SITE_OTHER): Payer: BLUE CROSS/BLUE SHIELD

## 2015-10-11 DIAGNOSIS — E669 Obesity, unspecified: Secondary | ICD-10-CM

## 2015-10-11 DIAGNOSIS — Z125 Encounter for screening for malignant neoplasm of prostate: Secondary | ICD-10-CM

## 2015-10-11 LAB — HEPATIC FUNCTION PANEL
ALK PHOS: 44 U/L (ref 39–117)
ALT: 27 U/L (ref 0–53)
AST: 22 U/L (ref 0–37)
Albumin: 4.4 g/dL (ref 3.5–5.2)
BILIRUBIN TOTAL: 0.7 mg/dL (ref 0.2–1.2)
Bilirubin, Direct: 0.1 mg/dL (ref 0.0–0.3)
Total Protein: 6.6 g/dL (ref 6.0–8.3)

## 2015-10-11 LAB — BASIC METABOLIC PANEL
BUN: 17 mg/dL (ref 6–23)
CHLORIDE: 104 meq/L (ref 96–112)
CO2: 30 mEq/L (ref 19–32)
Calcium: 9.7 mg/dL (ref 8.4–10.5)
Creatinine, Ser: 0.89 mg/dL (ref 0.40–1.50)
GFR: 93.21 mL/min (ref >60.00)
Glucose, Bld: 121 mg/dL — ABNORMAL HIGH (ref 70–99)
POTASSIUM: 4.7 meq/L (ref 3.5–5.1)
SODIUM: 140 meq/L (ref 135–145)

## 2015-10-11 LAB — PSA: PSA: 0.65 ng/mL (ref 0.10–4.00)

## 2015-10-11 LAB — LIPID PANEL
CHOL/HDL RATIO: 4
Cholesterol: 193 mg/dL (ref 0–200)
HDL: 53.3 mg/dL (ref >39.00)
LDL Cholesterol: 118 mg/dL — ABNORMAL HIGH (ref 0–99)
NONHDL: 139.81
TRIGLYCERIDES: 110 mg/dL (ref 0.0–149.0)
VLDL: 22 mg/dL (ref 0.0–40.0)

## 2015-10-18 ENCOUNTER — Encounter: Payer: Self-pay | Admitting: Family Medicine

## 2015-10-18 ENCOUNTER — Ambulatory Visit (INDEPENDENT_AMBULATORY_CARE_PROVIDER_SITE_OTHER): Payer: BLUE CROSS/BLUE SHIELD | Admitting: Family Medicine

## 2015-10-18 VITALS — BP 122/86 | HR 70 | Temp 98.3°F | Ht 73.0 in | Wt 281.0 lb

## 2015-10-18 DIAGNOSIS — Z0000 Encounter for general adult medical examination without abnormal findings: Secondary | ICD-10-CM | POA: Diagnosis not present

## 2015-10-18 DIAGNOSIS — R7303 Prediabetes: Secondary | ICD-10-CM | POA: Insufficient documentation

## 2015-10-18 DIAGNOSIS — R739 Hyperglycemia, unspecified: Secondary | ICD-10-CM

## 2015-10-18 DIAGNOSIS — E669 Obesity, unspecified: Secondary | ICD-10-CM

## 2015-10-18 DIAGNOSIS — E1169 Type 2 diabetes mellitus with other specified complication: Secondary | ICD-10-CM | POA: Insufficient documentation

## 2015-10-18 NOTE — Patient Instructions (Addendum)
Avoid added sugars. Work on weight loss.  Good to see you today, call us with questions. Return as needed or in 1 year for next physical.  Health Maintenance, Male A healthy lifestyle and preventative care can promote health and wellness.  Maintain regular health, dental, and eye exams.  Eat a healthy diet. Foods like vegetables, fruits, whole grains, low-fat dairy products, and lean protein foods contain the nutrients you need and are low in calories. Decrease your intake of foods high in solid fats, added sugars, and salt. Get information about a proper diet from your health care provider, if necessary.  Regular physical exercise is one of the most important things you can do for your health. Most adults should get at least 150 minutes of moderate-intensity exercise (any activity that increases your heart rate and causes you to sweat) each week. In addition, most adults need muscle-strengthening exercises on 2 or more days a week.   Maintain a healthy weight. The body mass index (BMI) is a screening tool to identify possible weight problems. It provides an estimate of body fat based on height and weight. Your health care provider can find your BMI and can help you achieve or maintain a healthy weight. For males 20 years and older:  A BMI below 18.5 is considered underweight.  A BMI of 18.5 to 24.9 is normal.  A BMI of 25 to 29.9 is considered overweight.  A BMI of 30 and above is considered obese.  Maintain normal blood lipids and cholesterol by exercising and minimizing your intake of saturated fat. Eat a balanced diet with plenty of fruits and vegetables. Blood tests for lipids and cholesterol should begin at age 32 and be repeated every 5 years. If your lipid or cholesterol levels are high, you are over age 34, or you are at high risk for heart disease, you may need your cholesterol levels checked more frequently.Ongoing high lipid and cholesterol levels should be treated with medicines  if diet and exercise are not working.  If you smoke, find out from your health care provider how to quit. If you do not use tobacco, do not start.  Lung cancer screening is recommended for adults aged 62-80 years who are at high risk for developing lung cancer because of a history of smoking. A yearly low-dose CT scan of the lungs is recommended for people who have at least a 30-pack-year history of smoking and are current smokers or have quit within the past 15 years. A pack year of smoking is smoking an average of 1 pack of cigarettes a day for 1 year (for example, a 30-pack-year history of smoking could mean smoking 1 pack a day for 30 years or 2 packs a day for 15 years). Yearly screening should continue until the smoker has stopped smoking for at least 15 years. Yearly screening should be stopped for people who develop a health problem that would prevent them from having lung cancer treatment.  If you choose to drink alcohol, do not have more than 2 drinks per day. One drink is considered to be 12 oz (360 mL) of beer, 5 oz (150 mL) of wine, or 1.5 oz (45 mL) of liquor.  Avoid the use of street drugs. Do not share needles with anyone. Ask for help if you need support or instructions about stopping the use of drugs.  High blood pressure causes heart disease and increases the risk of stroke. High blood pressure is more likely to develop in:  People  who have blood pressure in the end of the normal range (100-139/85-89 mm Hg).  People who are overweight or obese.  People who are African American.  If you are 40-44 years of age, have your blood pressure checked every 3-5 years. If you are 66 years of age or older, have your blood pressure checked every year. You should have your blood pressure measured twice--once when you are at a hospital or clinic, and once when you are not at a hospital or clinic. Record the average of the two measurements. To check your blood pressure when you are not at a  hospital or clinic, you can use:  An automated blood pressure machine at a pharmacy.  A home blood pressure monitor.  If you are 51-13 years old, ask your health care provider if you should take aspirin to prevent heart disease.  Diabetes screening involves taking a blood sample to check your fasting blood sugar level. This should be done once every 3 years after age 67 if you are at a normal weight and without risk factors for diabetes. Testing should be considered at a younger age or be carried out more frequently if you are overweight and have at least 1 risk factor for diabetes.  Colorectal cancer can be detected and often prevented. Most routine colorectal cancer screening begins at the age of 73 and continues through age 80. However, your health care provider may recommend screening at an earlier age if you have risk factors for colon cancer. On a yearly basis, your health care provider may provide home test kits to check for hidden blood in the stool. A small camera at the end of a tube may be used to directly examine the colon (sigmoidoscopy or colonoscopy) to detect the earliest forms of colorectal cancer. Talk to your health care provider about this at age 81 when routine screening begins. A direct exam of the colon should be repeated every 5-10 years through age 21, unless early forms of precancerous polyps or small growths are found.  People who are at an increased risk for hepatitis B should be screened for this virus. You are considered at high risk for hepatitis B if:  You were born in a country where hepatitis B occurs often. Talk with your health care provider about which countries are considered high risk.  Your parents were born in a high-risk country and you have not received a shot to protect against hepatitis B (hepatitis B vaccine).  You have HIV or AIDS.  You use needles to inject street drugs.  You live with, or have sex with, someone who has hepatitis B.  You are a  man who has sex with other men (MSM).  You get hemodialysis treatment.  You take certain medicines for conditions like cancer, organ transplantation, and autoimmune conditions.  Hepatitis C blood testing is recommended for all people born from [birth year] through 1963 and any individual with known risk factors for hepatitis C.  Healthy men should no longer receive prostate-specific antigen (PSA) blood tests as part of routine cancer screening. Talk to your health care provider about prostate cancer screening.  Testicular cancer screening is not recommended for adolescents or adult males who have no symptoms. Screening includes self-exam, a health care provider exam, and other screening tests. Consult with your health care provider about any symptoms you have or any concerns you have about testicular cancer.  Practice safe sex. Use condoms and avoid high-risk sexual practices to reduce the spread of sexually  transmitted infections (STIs).  You should be screened for STIs, including gonorrhea and chlamydia if:  You are sexually active and are younger than 24 years.  You are older than 24 years, and your health care provider tells you that you are at risk for this type of infection.  Your sexual activity has changed since you were last screened, and you are at an increased risk for chlamydia or gonorrhea. Ask your health care provider if you are at risk.  If you are at risk of being infected with HIV, it is recommended that you take a prescription medicine daily to prevent HIV infection. This is called pre-exposure prophylaxis (PrEP). You are considered at risk if:  You are a man who has sex with other men (MSM).  You are a heterosexual man who is sexually active with multiple partners.  You take drugs by injection.  You are sexually active with a partner who has HIV.  Talk with your health care provider about whether you are at high risk of being infected with HIV. If you choose to begin PrEP,  you should first be tested for HIV. You should then be tested every 3 months for as long as you are taking PrEP.  Use sunscreen. Apply sunscreen liberally and repeatedly throughout the day. You should seek shade when your shadow is shorter than you. Protect yourself by wearing long sleeves, pants, a wide-brimmed hat, and sunglasses year round whenever you are outdoors.  Tell your health care provider of new moles or changes in moles, especially if there is a change in shape or color. Also, tell your health care provider if a mole is larger than the size of a pencil eraser.  A one-time screening for abdominal aortic aneurysm (AAA) and surgical repair of large AAAs by ultrasound is recommended for men aged 62-75 years who are current or former smokers.  Stay current with your vaccines (immunizations).   This information is not intended to replace advice given to you by your health care provider. Make sure you discuss any questions you have with your health care provider.   Document Released: 09/16/2007 Document Revised: 04/10/2014 Document Reviewed: 08/15/2010 Elsevier Interactive Patient Education [employer].

## 2015-10-18 NOTE — Assessment & Plan Note (Signed)
Preventative protocols reviewed and updated unless pt declined. Discussed healthy diet and lifestyle.  

## 2015-10-18 NOTE — Progress Notes (Signed)
BP 122/86 mmHg  Pulse 70  Temp(Src) 98.3 F (36.8 C) (Oral)  Ht 6\' 1"  (1.854 m)  Wt 281 lb (127.461 kg)  BMI 37.08 kg/m2  SpO2 97%   CC: CPE  Subjective:    Patient ID: Jordan Reynolds, male    DOB: [birth year], 60 y.o.   MRN: [id number]  HPI: KELLEN SCHEIDEGGER is a 60 y.o. male presenting on 10/18/2015 for Annual Exam   H/o primary syphilis last year. He talked with wife. She was tested. Pt endorses relationship is stronger.   Preventative: COLONOSCOPY Date: 03/2015 TA, rpt 5 yrs Ardis Hughs) Prostate cancer screening - father with h/o prostate cancer - requests checked today. Flu - doesn't receive Td 2009, Tdap 10/2012 Seat belt use discussed Sunscreen use discussed, does not use. No changing moles.  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 Interim medical history since our last visit reviewed. Allergies and medications reviewed and updated. Current Outpatient Prescriptions on File Prior to Visit  Medication Sig  . Ascorbic Acid (VITAMIN C) 1000 MG tablet Take 1,000 mg by mouth daily.  . Multiple Vitamin (MULTIVITAMIN) tablet Take 1 tablet by mouth daily.  . vitamin B-12 (CYANOCOBALAMIN) 1000 MCG tablet Take 1,000 mcg by mouth daily.   No current facility-administered medications on file prior to visit.    Review of Systems  Constitutional: Negative for fever, chills, activity change, appetite change, fatigue and unexpected weight change.  HENT: Negative for hearing loss.   Eyes: Negative for visual disturbance.  Respiratory: Negative for cough, chest tightness, shortness of breath and wheezing.   Cardiovascular: Negative for chest pain, palpitations and leg swelling.  Gastrointestinal: Negative for nausea, vomiting,  abdominal pain, diarrhea, constipation, blood in stool and abdominal distention.  Genitourinary: Negative for hematuria and difficulty urinating.  Musculoskeletal: Negative for myalgias, arthralgias and neck pain.  Skin: Negative for rash.  Neurological: Negative for dizziness, seizures, syncope and headaches.  Hematological: Negative for adenopathy. Does not bruise/bleed easily.  Psychiatric/Behavioral: Negative for dysphoric mood. The patient is not nervous/anxious.    Per HPI unless specifically indicated in ROS section     Objective:    BP 122/86 mmHg  Pulse 70  Temp(Src) 98.3 F (36.8 C) (Oral)  Ht 6\' 1"  (1.854 m)  Wt 281 lb (127.461 kg)  BMI 37.08 kg/m2  SpO2 97%  Wt Readings from Last 3 Encounters:  10/18/15 281 lb (127.461 kg)  03/12/15 261 lb (118.389 kg)  02/22/15 261 lb (118.389 kg)    Physical Exam  Constitutional: He is oriented to person, place, and time. He appears well-developed and well-nourished. No distress.  HENT:  Head: Normocephalic and atraumatic.  Right Ear: Hearing, tympanic membrane, external ear and ear canal normal.  Left Ear: Hearing, tympanic membrane, external ear and ear canal normal.  Nose: Nose normal.  Mouth/Throat: Uvula is midline, oropharynx is clear and moist and mucous membranes are normal. No oropharyngeal exudate, posterior oropharyngeal edema or posterior oropharyngeal erythema.  Eyes: Conjunctivae and EOM are normal. Pupils are equal, round, and reactive to light. No scleral icterus.  Neck: Normal range of motion. Neck supple. Carotid bruit is not present. No thyromegaly present.  Cardiovascular: Normal rate, regular rhythm, normal heart sounds and intact distal pulses.  No murmur heard. Pulses:      Radial pulses are 2+ on the right side, and 2+ on the left side.  Pulmonary/Chest: Effort normal and breath sounds normal. No respiratory distress. He has no wheezes. He has no rales.  Abdominal: Soft. Bowel sounds are normal. He  exhibits no distension and no mass. There is no tenderness. There is no rebound and no guarding.  Genitourinary: Rectum normal and prostate normal. Rectal exam shows no external hemorrhoid, no internal hemorrhoid, no fissure, no mass, no tenderness and anal tone normal. Prostate is not enlarged and not tender.  Musculoskeletal: Normal range of motion. He exhibits no edema.  Lymphadenopathy:    He has no cervical adenopathy.  Neurological: He is alert and oriented to person, place, and time.  CN grossly intact, station and gait intact  Skin: Skin is warm and dry. No rash noted.  Psychiatric: He has a normal mood and affect. His behavior is normal. Judgment and thought content normal.  Nursing note and vitals reviewed.  Results for orders placed or performed in visit on 10/11/15  Lipid panel  Result Value Ref Range   Cholesterol 193 0 - 200 mg/dL   Triglycerides 110.0 0.0 - 149.0 mg/dL   HDL 53.30 >39.00 mg/dL   VLDL 22.0 0.0 - 40.0 mg/dL   LDL Cholesterol 118 (H) 0 - 99 mg/dL   Total CHOL/HDL Ratio 4    NonHDL 139.81   PSA  Result Value Ref Range   PSA 0.65 0.10 - 4.00 ng/mL  Basic metabolic panel  Result Value Ref Range   Sodium 140 135 - 145 mEq/L   Potassium 4.7 3.5 - 5.1 mEq/L   Chloride 104 96 - 112 mEq/L   CO2 30 19 - 32 mEq/L   Glucose, Bld 121 (H) 70 - 99 mg/dL   BUN 17 6 - 23 mg/dL   Creatinine, Ser 0.89 0.40 - 1.50 mg/dL   Calcium 9.7 8.4 - 10.5 mg/dL   GFR 93.21 >60.00 mL/min  Hepatic function panel  Result Value Ref Range   Total Bilirubin 0.7 0.2 - 1.2 mg/dL   Bilirubin, Direct 0.1 0.0 - 0.3 mg/dL   Alkaline Phosphatase 44 39 - 117 U/L   AST 22 0 - 37 U/L   ALT 27 0 - 53 U/L   Total Protein 6.6 6.0 - 8.3 g/dL   Albumin 4.4 3.5 - 5.2 g/dL      Assessment & Plan:   Problem List Items Addressed This Visit    Healthcare maintenance - Primary    Preventative protocols reviewed and updated unless pt declined. Discussed healthy diet and lifestyle.        Hyperglycemia    Discussed relationship to increased weight.  Encouraged avoiding added sugars.       Obesity, Class II, BMI 35-39.9, no comorbidity (HCC)    Discussed healthy diet and lifestyle changes to affect sustainable weight loss.          Follow up plan: Return in about 1 year (around 10/17/2016), or as needed, for annual exam, prior fasting for blood work.  Ria Bush, MD

## 2015-10-18 NOTE — Progress Notes (Signed)
Pre visit review using our clinic review tool, if applicable. No additional management support is needed unless otherwise documented below in the visit note. 

## 2015-10-18 NOTE — Assessment & Plan Note (Addendum)
Discussed relationship to increased weight.  Encouraged avoiding added sugars.

## 2015-10-18 NOTE — Assessment & Plan Note (Signed)
Discussed healthy diet and lifestyle changes to affect sustainable weight loss  

## 2016-10-15 ENCOUNTER — Other Ambulatory Visit: Payer: Self-pay | Admitting: Family Medicine

## 2016-10-15 DIAGNOSIS — R739 Hyperglycemia, unspecified: Secondary | ICD-10-CM

## 2016-10-15 DIAGNOSIS — Z125 Encounter for screening for malignant neoplasm of prostate: Secondary | ICD-10-CM

## 2016-10-16 ENCOUNTER — Other Ambulatory Visit (INDEPENDENT_AMBULATORY_CARE_PROVIDER_SITE_OTHER): Payer: Commercial Managed Care - PPO

## 2016-10-16 DIAGNOSIS — R739 Hyperglycemia, unspecified: Secondary | ICD-10-CM | POA: Diagnosis not present

## 2016-10-16 DIAGNOSIS — Z125 Encounter for screening for malignant neoplasm of prostate: Secondary | ICD-10-CM

## 2016-10-16 LAB — COMPREHENSIVE METABOLIC PANEL
ALBUMIN: 4.4 g/dL (ref 3.5–5.2)
ALK PHOS: 35 U/L — AB (ref 39–117)
ALT: 23 U/L (ref 0–53)
AST: 20 U/L (ref 0–37)
BUN: 21 mg/dL (ref 6–23)
CALCIUM: 9.7 mg/dL (ref 8.4–10.5)
CO2: 29 mEq/L (ref 19–32)
Chloride: 106 mEq/L (ref 96–112)
Creatinine, Ser: 1.02 mg/dL (ref 0.40–1.50)
GFR: 79.36 mL/min (ref >60.00)
Glucose, Bld: 141 mg/dL — ABNORMAL HIGH (ref 70–99)
POTASSIUM: 5 meq/L (ref 3.5–5.1)
Sodium: 140 mEq/L (ref 135–145)
TOTAL PROTEIN: 6.3 g/dL (ref 6.0–8.3)
Total Bilirubin: 0.6 mg/dL (ref 0.2–1.2)

## 2016-10-16 LAB — LIPID PANEL
CHOLESTEROL: 179 mg/dL (ref 0–200)
HDL: 50.7 mg/dL (ref >39.00)
LDL Cholesterol: 114 mg/dL — ABNORMAL HIGH (ref 0–99)
NonHDL: 128.71
Total CHOL/HDL Ratio: 4
Triglycerides: 74 mg/dL (ref 0.0–149.0)
VLDL: 14.8 mg/dL (ref 0.0–40.0)

## 2016-10-16 LAB — HEMOGLOBIN A1C: HEMOGLOBIN A1C: 5.8 % (ref 4.6–6.5)

## 2016-10-16 LAB — PSA: PSA: 0.64 ng/mL (ref 0.10–4.00)

## 2016-10-23 ENCOUNTER — Encounter: Payer: BLUE CROSS/BLUE SHIELD | Admitting: Family Medicine

## 2016-10-27 ENCOUNTER — Encounter: Payer: Self-pay | Admitting: Family Medicine

## 2016-10-27 ENCOUNTER — Ambulatory Visit (INDEPENDENT_AMBULATORY_CARE_PROVIDER_SITE_OTHER): Payer: Commercial Managed Care - PPO | Admitting: Family Medicine

## 2016-10-27 VITALS — BP 118/78 | HR 81 | Temp 98.3°F | Ht 72.75 in | Wt 282.0 lb

## 2016-10-27 DIAGNOSIS — Z0000 Encounter for general adult medical examination without abnormal findings: Secondary | ICD-10-CM | POA: Diagnosis not present

## 2016-10-27 DIAGNOSIS — R7303 Prediabetes: Secondary | ICD-10-CM

## 2016-10-27 DIAGNOSIS — Z8719 Personal history of other diseases of the digestive system: Secondary | ICD-10-CM | POA: Insufficient documentation

## 2016-10-27 DIAGNOSIS — E66812 Obesity, class 2: Secondary | ICD-10-CM

## 2016-10-27 DIAGNOSIS — E7800 Pure hypercholesterolemia, unspecified: Secondary | ICD-10-CM

## 2016-10-27 DIAGNOSIS — L853 Xerosis cutis: Secondary | ICD-10-CM | POA: Diagnosis not present

## 2016-10-27 DIAGNOSIS — K4090 Unilateral inguinal hernia, without obstruction or gangrene, not specified as recurrent: Secondary | ICD-10-CM

## 2016-10-27 DIAGNOSIS — E669 Obesity, unspecified: Secondary | ICD-10-CM | POA: Diagnosis not present

## 2016-10-27 DIAGNOSIS — E785 Hyperlipidemia, unspecified: Secondary | ICD-10-CM | POA: Insufficient documentation

## 2016-10-27 NOTE — Patient Instructions (Addendum)
Look into udder cream and pumice stone for feet.  Possible right hernia - let us know if discomfort or worsening bulge for referral to surgeon.  Work on [employer] and regular exercise regimen for goal weight loss.  Return as needed or in 1 year for next physical.   Health Maintenance, Male A healthy lifestyle and preventive care is important for your health and wellness. Ask your health care provider about what schedule of regular examinations is right for you. What should I know about weight and diet? Eat a Healthy Diet  Eat plenty of vegetables, fruits, whole grains, low-fat dairy products, and lean protein.  Do not eat a lot of foods high in solid fats, added sugars, or salt.  Maintain a Healthy Weight Regular exercise can help you achieve or maintain a healthy weight. You should:  Do at least 150 minutes of exercise each week. The exercise should increase your heart rate and make you sweat (moderate-intensity exercise).  Do strength-training exercises at least twice a week.  Watch Your Levels of Cholesterol and Blood Lipids  Have your blood tested for lipids and cholesterol every 5 years starting at 59 years of age. If you are at high risk for heart disease, you should start having your blood tested when you are 59 years old. You may need to have your cholesterol levels checked more often if: ? Your lipid or cholesterol levels are high. ? You are older than 59 years of age. ? You are at high risk for heart disease.  What should I know about cancer screening? Many types of cancers can be detected early and may often be prevented. Lung Cancer  You should be screened every year for lung cancer if: ? You are a current smoker who has smoked for at least 30 years. ? You are a former smoker who has quit within the past 15 years.  Talk to your health care provider about your screening options, when you should start screening, and how often you should be screened.  Colorectal  Cancer  Routine colorectal cancer screening usually begins at 59 years of age and should be repeated every 5-10 years until you are 59 years old. You may need to be screened more often if early forms of precancerous polyps or small growths are found. Your health care provider may recommend screening at an earlier age if you have risk factors for colon cancer.  Your health care provider may recommend using home test kits to check for hidden blood in the stool.  A small camera at the end of a tube can be used to examine your colon (sigmoidoscopy or colonoscopy). This checks for the earliest forms of colorectal cancer.  Prostate and Testicular Cancer  Depending on your age and overall health, your health care provider may do certain tests to screen for prostate and testicular cancer.  Talk to your health care provider about any symptoms or concerns you have about testicular or prostate cancer.  Skin Cancer  Check your skin from head to toe regularly.  Tell your health care provider about any new moles or changes in moles, especially if: ? There is a change in a mole's size, shape, or color. ? You have a mole that is larger than a pencil eraser.  Always use sunscreen. Apply sunscreen liberally and repeat throughout the day.  Protect yourself by wearing long sleeves, pants, a wide-brimmed hat, and sunglasses when outside.  What should I know about heart disease, diabetes, and high blood  pressure?  If you are 82-86 years of age, have your blood pressure checked every 3-5 years. If you are 57 years of age or older, have your blood pressure checked every year. You should have your blood pressure measured twice-once when you are at a hospital or clinic, and once when you are not at a hospital or clinic. Record the average of the two measurements. To check your blood pressure when you are not at a hospital or clinic, you can use: ? An automated blood pressure machine at a pharmacy. ? A home blood  pressure monitor.  Talk to your health care provider about your target blood pressure.  If you are between 35-35 years old, ask your health care provider if you should take aspirin to prevent heart disease.  Have regular diabetes screenings by checking your fasting blood sugar level. ? If you are at a normal weight and have a low risk for diabetes, have this test once every three years after the age of 22. ? If you are overweight and have a high risk for diabetes, consider being tested at a younger age or more often.  A one-time screening for abdominal aortic aneurysm (AAA) by ultrasound is recommended for men aged 35-75 years who are current or former smokers. What should I know about preventing infection? Hepatitis B If you have a higher risk for hepatitis B, you should be screened for this virus. Talk with your health care provider to find out if you are at risk for hepatitis B infection. Hepatitis C Blood testing is recommended for:  Everyone born from [birth year] through 1963.  Anyone with known risk factors for hepatitis C.  Sexually Transmitted Diseases (STDs)  You should be screened each year for STDs including gonorrhea and chlamydia if: ? You are sexually active and are younger than 59 years of age. ? You are older than 59 years of age and your health care provider tells you that you are at risk for this type of infection. ? Your sexual activity has changed since you were last screened and you are at an increased risk for chlamydia or gonorrhea. Ask your health care provider if you are at risk.  Talk with your health care provider about whether you are at high risk of being infected with HIV. Your health care provider may recommend a prescription medicine to help prevent HIV infection.  What else can I do?  Schedule regular health, dental, and eye exams.  Stay current with your vaccines (immunizations).  Do not use any tobacco products, such as cigarettes, chewing tobacco, and  e-cigarettes. If you need help quitting, ask your health care provider.  Limit alcohol intake to no more than 2 drinks per day. One drink equals 12 ounces of beer, 5 ounces of wine, or 1 ounces of hard liquor.  Do not use street drugs.  Do not share needles.  Ask your health care provider for help if you need support or information about quitting drugs.  Tell your health care provider if you often feel depressed.  Tell your health care provider if you have ever been abused or do not feel safe at home. This information is not intended to replace advice given to you by your health care provider. Make sure you discuss any questions you have with your health care provider. Document Released: 09/16/2007 Document Revised: 11/17/2015 Document Reviewed: 12/22/2014 Elsevier Interactive Patient Education  [employer].

## 2016-10-27 NOTE — Assessment & Plan Note (Signed)
Mild off meds. The 10-year ASCVD risk score Mikey Bussing DC Brooke Bonito., et al., 2013) is: 6.2%   Values used to calculate the score:     Age: 61 years     Sex: Male     Is Non-Hispanic African American: No     Diabetic: No     Tobacco smoker: No     Systolic Blood Pressure: 409 mmHg     Is BP treated: No     HDL Cholesterol: 50.7 mg/dL     Total Cholesterol: 179 mg/dL

## 2016-10-27 NOTE — Progress Notes (Addendum)
BP 118/78 (BP Location: Left Arm, Patient Position: Sitting, Cuff Size: Large)   Pulse 81   Temp 98.3 F (36.8 C) (Oral)   Ht 6' 0.75" (1.848 m)   Wt 282 lb (127.9 kg)   SpO2 95%   BMI 37.46 kg/m    CC: CPE Subjective:    Patient ID: Jordan Reynolds, male    DOB: 09/13/1955, 61 y.o.   MRN: [id number]  HPI: JOHNERIC MCFADDEN is a 61 y.o. male presenting on 10/27/2016 for Annual Exam and Foot Irritation (Spot on bottom of right foot that comes and goes)   R sole lesion - ?wart. Noticed 4-5 months ago. Comes and goes - able to dig off.   H/o primary syphilis 2016. He talked with wife. She was tested. Pt endorses relationship is stronger.   Preventative: COLONOSCOPY Date: 03/2015 TA, rpt 5 yrs Ardis Hughs) Prostate cancer screening - father with h/o prostate cancer - requests checked yearly. Lung cancer screening - not eligible - quit >15 yrs ago Flu - doesn't receive Td 2009, Tdap 10/2012 Seat belt use discussed Sunscreen use discussed. No changing moles. Ex smoker - quit 1998, 30+ PY hx.  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 Interim medical history since our last visit reviewed. Allergies and medications reviewed and updated. Outpatient Medications Prior to Visit  Medication Sig Dispense Refill  . Ascorbic Acid (VITAMIN C) 1000 MG tablet Take 1,000 mg by mouth daily.    . Multiple Vitamin (MULTIVITAMIN) tablet Take 1 tablet by mouth daily.    . vitamin B-12 (CYANOCOBALAMIN) 1000 MCG tablet Take 1,000 mcg by mouth daily.     No facility-administered medications prior to visit.      Per HPI unless specifically indicated in ROS section below Review of Systems    Constitutional: Negative for activity change, appetite change, chills, fatigue, fever and unexpected weight change.  HENT: Negative for hearing loss.        Summer cold  Eyes: Negative for visual disturbance.  Respiratory: Positive for cough. Negative for chest tightness, shortness of breath and wheezing.   Cardiovascular: Negative for chest pain, palpitations and leg swelling.  Gastrointestinal: Negative for abdominal distention, abdominal pain, blood in stool, constipation, diarrhea, nausea and vomiting.  Genitourinary: Negative for difficulty urinating and hematuria.  Musculoskeletal: Negative for arthralgias, myalgias and neck pain.  Skin: Negative for rash.  Neurological: Negative for dizziness, seizures, syncope and headaches.  Hematological: Negative for adenopathy. Does not bruise/bleed easily.  Psychiatric/Behavioral: Negative for dysphoric mood. The patient is not nervous/anxious.        Objective:    BP 118/78 (BP Location: Left Arm, Patient Position: Sitting, Cuff Size: Large)   Pulse 81   Temp 98.3 F (36.8 C) (Oral)   Ht 6' 0.75" (1.848 m)   Wt 282 lb (127.9 kg)   SpO2 95%   BMI 37.46 kg/m   Wt Readings from Last 3 Encounters:  10/27/16 282 lb (127.9 kg)  10/18/15 281 lb (127.5 kg)  03/12/15 261 lb (118.4 kg)    Physical Exam  Constitutional: He is oriented to person, place, and time. He appears well-developed and well-nourished. No distress.  HENT:  Head: Normocephalic and atraumatic.  Right Ear: Hearing, tympanic membrane, external ear and ear canal normal.  Left Ear: Hearing, tympanic membrane, external ear and ear canal normal.  Nose: Nose normal.  Mouth/Throat: Uvula is midline, oropharynx is clear and moist and mucous membranes are normal. No oropharyngeal exudate, posterior oropharyngeal edema or posterior oropharyngeal erythema.  Eyes: Pupils are equal, round, and reactive to light. Conjunctivae and EOM are normal. No scleral icterus.  Neck: Normal  range of motion. Neck supple. No thyromegaly present.  Cardiovascular: Normal rate, regular rhythm, normal heart sounds and intact distal pulses.   No murmur heard. Pulses:      Radial pulses are 2+ on the right side, and 2+ on the left side.  Pulmonary/Chest: Effort normal and breath sounds normal. No respiratory distress. He has no wheezes. He has no rales.  Abdominal: Soft. Bowel sounds are normal. He exhibits no distension and no mass. There is no tenderness. There is no rebound and no guarding. A hernia is present. Hernia confirmed positive in the right inguinal area (small). Hernia confirmed negative in the left inguinal area.  Genitourinary: Rectum normal, prostate normal and penis normal. Rectal exam shows no external hemorrhoid, no internal hemorrhoid, no fissure, no mass, no tenderness and anal tone normal. Prostate is not enlarged (15gm) and not tender. Uncircumcised.  Musculoskeletal: Normal range of motion. He exhibits no edema.  Lymphadenopathy:    He has no cervical adenopathy.       Right: No inguinal adenopathy present.       Left: No inguinal adenopathy present.  Neurological: He is alert and oriented to person, place, and time.  CN grossly intact, station and gait intact  Skin: Skin is warm and dry. No rash noted.  Psychiatric: He has a normal mood and affect. His behavior is normal. Judgment and thought content normal.  Nursing note and vitals reviewed.  Results for orders placed or performed in visit on 10/16/16  Lipid panel  Result Value Ref Range   Cholesterol 179 0 - 200 mg/dL   Triglycerides 74.0 0.0 - 149.0 mg/dL   HDL 50.70 >39.00 mg/dL   VLDL 14.8 0.0 - 40.0 mg/dL   LDL Cholesterol 114 (H) 0 - 99 mg/dL   Total CHOL/HDL Ratio 4    NonHDL 128.71   Comprehensive metabolic panel  Result Value Ref Range   Sodium 140 135 - 145 mEq/L   Potassium 5.0 3.5 - 5.1 mEq/L   Chloride 106 96 - 112 mEq/L   CO2 29 19 - 32 mEq/L   Glucose, Bld 141 (H) 70 - 99 mg/dL   BUN  21 6 - 23 mg/dL   Creatinine, Ser 1.02 0.40 - 1.50 mg/dL   Total Bilirubin 0.6 0.2 - 1.2 mg/dL   Alkaline Phosphatase 35 (L) 39 - 117 U/L   AST 20 0 - 37 U/L   ALT 23 0 - 53 U/L   Total Protein 6.3 6.0 - 8.3 g/dL   Albumin 4.4 3.5 - 5.2 g/dL   Calcium 9.7 8.4 - 10.5 mg/dL   GFR 79.36 >60.00 mL/min  Hemoglobin A1c  Result Value Ref Range   Hgb A1c MFr Bld 5.8 4.6 - 6.5 %  PSA  Result Value Ref Range   PSA 0.64 0.10 - 4.00 ng/mL      Assessment & Plan:   Problem List Items Addressed This Visit    Dry skin    Dry skin of feet - rec moisturizing/udder cream and pumice stone use.  Healthcare maintenance - Primary    Preventative protocols reviewed and updated unless pt declined. Discussed healthy diet and lifestyle.       HLD (hyperlipidemia)    Mild off meds. The 10-year ASCVD risk score Mikey Bussing DC Brooke Bonito., et al., 2013) is: 6.2%   Values used to calculate the score:     Age: 49 years     Sex: Male     Is Non-Hispanic African American: No     Diabetic: No     Tobacco smoker: No     Systolic Blood Pressure: 431 mmHg     Is BP treated: No     HDL Cholesterol: 50.7 mg/dL     Total Cholesterol: 179 mg/dL       Obesity, Class II, BMI 35-39.9, no comorbidity    Discussed healthy diet and lifestyle changes to affect sustainable weight loss.       Prediabetes    Reviewed with patient. Encouraged weight loss and caution with sugars and simple carbs.       Right inguinal hernia    Small right hernia. Reviewed with patient. Pt will let me know if desires surgical evaluation.           Follow up plan: Return in about 1 year (around 10/27/2017) for annual exam, prior fasting for blood work.  Ria Bush, MD

## 2016-10-27 NOTE — Assessment & Plan Note (Signed)
Discussed healthy diet and lifestyle changes to affect sustainable weight loss  

## 2016-10-27 NOTE — Assessment & Plan Note (Signed)
Dry skin of feet - rec moisturizing/udder cream and pumice stone use.

## 2016-10-27 NOTE — Assessment & Plan Note (Signed)
Small right hernia. Reviewed with patient. Pt will let me know if desires surgical evaluation.

## 2016-10-27 NOTE — Assessment & Plan Note (Signed)
Preventative protocols reviewed and updated unless pt declined. Discussed healthy diet and lifestyle.  

## 2016-10-27 NOTE — Assessment & Plan Note (Addendum)
Reviewed with patient. Encouraged weight loss and caution with sugars and simple carbs.

## 2017-04-05 ENCOUNTER — Emergency Department (HOSPITAL_COMMUNITY)
Admission: EM | Admit: 2017-04-05 | Discharge: 2017-04-05 | Disposition: A | Discharge status: Home / Self Care | Payer: Worker's Compensation | Attending: Emergency Medicine | Admitting: Emergency Medicine

## 2017-04-05 ENCOUNTER — Emergency Department (HOSPITAL_COMMUNITY): Payer: Worker's Compensation

## 2017-04-05 ENCOUNTER — Encounter (HOSPITAL_COMMUNITY): Payer: Self-pay | Admitting: *Deleted

## 2017-04-05 ENCOUNTER — Other Ambulatory Visit: Payer: Self-pay

## 2017-04-05 DIAGNOSIS — K4090 Unilateral inguinal hernia, without obstruction or gangrene, not specified as recurrent: Secondary | ICD-10-CM | POA: Insufficient documentation

## 2017-04-05 DIAGNOSIS — M25551 Pain in right hip: Secondary | ICD-10-CM | POA: Diagnosis not present

## 2017-04-05 DIAGNOSIS — Z87891 Personal history of nicotine dependence: Secondary | ICD-10-CM | POA: Insufficient documentation

## 2017-04-05 DIAGNOSIS — Z79899 Other long term (current) drug therapy: Secondary | ICD-10-CM | POA: Insufficient documentation

## 2017-04-05 DIAGNOSIS — M79604 Pain in right leg: Secondary | ICD-10-CM | POA: Diagnosis not present

## 2017-04-05 DIAGNOSIS — R1031 Right lower quadrant pain: Secondary | ICD-10-CM | POA: Diagnosis present

## 2017-04-05 MED ORDER — NAPROXEN 250 MG PO TABS
375.0000 mg | ORAL_TABLET | Freq: Once | ORAL | Status: AC
  Administered 2017-04-05: 375 mg via ORAL
  Filled 2017-04-05: qty 2

## 2017-04-05 MED ORDER — TRAMADOL HCL 50 MG PO TABS: 50.0000 mg | ORAL_TABLET | Freq: Four times a day (QID) | ORAL | 0 refills | Status: DC | PRN

## 2017-04-05 NOTE — ED Triage Notes (Signed)
Pt reports feeling something "pull" in his groin today at work. Pt reports having a shooting pain that runs down his leg. Pt denies swelling to groin.

## 2017-04-05 NOTE — ED Notes (Signed)
Pt returned from CT at this time.  

## 2017-04-05 NOTE — ED Notes (Signed)
Patient transported to CT 

## 2017-04-05 NOTE — ED Notes (Signed)
Pt placed in gown.  Ready for CT

## 2017-04-05 NOTE — Discharge Instructions (Signed)
Your CAT scan does show hernia of the right side that contains fat.  Keep ice pack on this area to help try to reduce the fat.  Have given you Ultram to take for pain.  Would continue taking NSAIDs such as Motrin, Aleve or ibuprofen along with Tylenol.  Have given you a referral to general surgery I would call tomorrow for an appointment next week.  Return to the ED if you have any vomiting, unable to have a bowel movement, worsening pain, redness over the affected site, fever, or for any other reason.

## 2017-04-05 NOTE — ED Provider Notes (Signed)
[facility] EMERGENCY DEPARTMENT Provider Note   CSN: [id number] Arrival date & time: 04/05/17  1233     History   Chief Complaint Chief Complaint  Patient presents with  . Groin Pain    HPI Jordan Reynolds is a 62 y.o. male.  HPI 62 year old Caucasian male with no pertinent past medical history presents to the emergency department today for evaluation of right groin pain.  The patient states that he was at work this afternoon when he made a quick turn l lifting up something heavy and had immediate pain in his right groin.  The patient states that the pain also goes to his right hip and goes midway down to his right leg.  Patient denies any testicular pain or swelling.  Denies any significant bulging from his right groin area.  Patient has not taking for the pain prior to arrival.  Palpation ambulation make the pain worse.  Nothing makes the pain better.  The patient does report having similar pain a few months ago that was seen by his primary care doctor.  States he may have a hernia but never had follow-up.  She denies any decreased bowel movements, vomiting, redness. Past Medical History:  Diagnosis Date  . Allergic rhinitis   . Ex-smoker 1998  . History of chicken pox   . Primary syphilis 10/13/2014    Patient Active Problem List   Diagnosis Date Noted  . HLD (hyperlipidemia) 10/27/2016  . Dry skin 10/27/2016  . Right inguinal hernia 10/27/2016  . Prediabetes 10/18/2015  . Obesity, Class II, BMI 35-39.9, no comorbidity 10/10/2013  . Healthcare maintenance 10/09/2012    Past Surgical History:  Procedure Laterality Date  . COLONOSCOPY  03/2015   TA, rpt 5 yrs Ardis Hughs)  . DENTAL SURGERY  2014   implants  . TONSILLECTOMY  1965       Home Medications    Prior to Admission medications   Medication Sig Start Date End Date Taking? Authorizing Provider  Ascorbic Acid (VITAMIN C) 1000 MG tablet Take 1,000 mg by mouth daily.    [provider]   Glucosamine-Chondroitin (GLUCOSAMINE CHONDR COMPLEX PO) Take by mouth.    [provider]  Multiple Vitamin (MULTIVITAMIN) tablet Take 1 tablet by mouth daily.    [provider]  vitamin B-12 (CYANOCOBALAMIN) 1000 MCG tablet Take 1,000 mcg by mouth daily.    [provider]    Family History Family History  Problem Relation Age of Onset  . Other Mother        benign brain tumor  . Diabetes Mother        early  . Stroke Mother   . Hyperlipidemia Mother   . CAD Father 76       unsure, deceased  . Cancer Father 8       prostate  . Hypertension Father   . Other Sister 73       some brainstem compression issue  . Cancer Maternal Uncle        leukemia  . Colon cancer Neg Hx     Social History Social History   Tobacco Use  . Smoking status: Former Research scientist (life sciences)  . Smokeless tobacco: Never Used  . Tobacco comment: quit October 1998  Substance Use Topics  . Alcohol use: No    Alcohol/week: 0.0 oz  . Drug use: No     Allergies   Patient has no known allergies.   Review of Systems Review of Systems  Constitutional:  Negative for chills and fever.  Gastrointestinal: Negative for abdominal pain, nausea and vomiting.  Genitourinary: Negative for discharge, dysuria, flank pain, penile swelling, scrotal swelling, testicular pain and urgency.       Groin pain right     Physical Exam Updated Vital Signs BP 129/84 (BP Location: Right Arm)   Pulse 79   Temp 98.2 F (36.8 C) (Oral)   Resp 16   SpO2 98%   Physical Exam  Constitutional: He appears well-developed and well-nourished. No distress.  HENT:  Head: Normocephalic and atraumatic.  Eyes: Right eye exhibits no discharge. Left eye exhibits no discharge. No scleral icterus.  Neck: Normal range of motion.  Pulmonary/Chest: No respiratory distress.  Abdominal: Bowel sounds are normal. There is tenderness in the right lower quadrant. There is no rigidity, no rebound, no guarding, no CVA  tenderness, no tenderness at McBurney's point and negative Murphy's sign. A hernia is present. Hernia confirmed positive in the right inguinal area (no erythema, ).    Genitourinary:  Genitourinary Comments: Chaperone present for exam. uncirumcised male. No penile discharge, erythema, tenderness, lesion, or rash. 2 descended testes without swelling, pain, lesions or rash.    Musculoskeletal: Normal range of motion.  Neurological: He is alert.  Skin: Skin is warm and dry. Capillary refill takes less than 2 seconds. No pallor.  Psychiatric: His behavior is normal. Judgment and thought content normal.  Nursing note and vitals reviewed.    ED Treatments / Results  Labs (all labs ordered are listed, but only abnormal results are displayed) Labs Reviewed - No data to display  EKG  EKG Interpretation None       Radiology Ct Abdomen Pelvis Wo Contrast  Result Date: 04/05/2017 CLINICAL DATA:  62 year old male with abdominal, pelvic and groin pain for 1 day. EXAM: CT ABDOMEN AND PELVIS WITHOUT CONTRAST TECHNIQUE: Multidetector CT imaging of the abdomen and pelvis was performed following the standard protocol without IV contrast. COMPARISON:  None. FINDINGS: Please note that parenchymal abnormalities may be missed without intravenous contrast. Lower chest: No acute abnormality Hepatobiliary: The liver and gallbladder are unremarkable. No biliary dilatation. Pancreas: Unremarkable Spleen: Unremarkable Adrenals/Urinary Tract: The kidneys, adrenal glands and bladder are unremarkable. Stomach/Bowel: Stomach is within normal limits. Appendix appears normal. No evidence of bowel wall thickening, distention, or inflammatory changes. Vascular/Lymphatic: Aortic atherosclerosis. No enlarged abdominal or pelvic lymph nodes. Reproductive: Prostate unremarkable Other: A moderate right inguinal hernia containing fat is noted. No ascites, pneumoperitoneum or focal collection. Musculoskeletal: No acute or  significant osseous findings. IMPRESSION: 1. Moderate right inguinal hernia containing fat. 2.  Aortic Atherosclerosis (ICD10-I70.0). Electronically Signed   By: Margarette Canada M.D.   On: 04/05/2017 17:11    Procedures Procedures (including critical care time)  Medications Ordered in ED Medications  naproxen (NAPROSYN) tablet 375 mg (not administered)     Initial Impression / Assessment and Plan / ED Course  I have reviewed the triage vital signs and the nursing notes.  Pertinent labs & imaging results that were available during my care of the patient were reviewed by me and considered in my medical decision making (see chart for details).     She presents to the ED with complaints of right groin pain.  Patient states it was worse after lifting some heavy at work today.  On exam patient has what appears to be a hernia of the right inguinal region however patient has an obese body habitus unable to differentiate.  Patient is has some tenderness to  palpation unable to reduce the hernia.  He has no testicular pain or swelling.  Patient was was requesting CT scan.  CT scan does show a moderate right inguinal hernia containing fat.  No other acute abnormalities were noted.  I did discuss the patient with Dr. Hulen Skains with general surgery who recommends patient follow-up in the outpatient setting does not need emergent intervention at this time.  Patient has no bowel loop incarceration.  Patient having no erythema, nausea, change in bowel habits.  Will treat with ice compressions and pain medication.  Encouraged general surgery follow-up tomorrow.  Have given patient very strict return precautions.  Patient was also discussed my attending who was agreeable the above plan.  Pt is hemodynamically stable, in NAD, & able to ambulate in the ED. Evaluation does not show pathology that would require ongoing emergent intervention or inpatient treatment. I explained the diagnosis to the patient. Pain has been  managed & has no complaints prior to dc. Pt is comfortable with above plan and is stable for discharge at this time. All questions were answered prior to disposition. Strict return precautions for f/u to the ED were discussed. Encouraged follow up with PCP.   Final Clinical Impressions(s) / ED Diagnoses   Final diagnoses:  Unilateral inguinal hernia without obstruction or gangrene, recurrence not specified    ED Discharge Orders        Ordered    traMADol (ULTRAM) 50 MG tablet  Every 6 hours PRN     04/05/17 1755       Doristine Devoid, PA-C 04/05/17 1811    Quintella Reichert, MD 04/06/17 1254

## 2017-05-03 ENCOUNTER — Ambulatory Visit: Payer: Self-pay | Admitting: General Surgery

## 2017-05-07 ENCOUNTER — Encounter (HOSPITAL_BASED_OUTPATIENT_CLINIC_OR_DEPARTMENT_OTHER): Payer: Self-pay | Admitting: *Deleted

## 2017-05-07 ENCOUNTER — Other Ambulatory Visit: Payer: Self-pay

## 2017-05-08 ENCOUNTER — Encounter (HOSPITAL_BASED_OUTPATIENT_CLINIC_OR_DEPARTMENT_OTHER)
Admission: RE | Admit: 2017-05-08 | Discharge: 2017-05-08 | Disposition: A | Discharge status: Home / Self Care | Payer: Worker's Compensation | Source: Ambulatory Visit | Attending: General Surgery | Admitting: General Surgery

## 2017-05-08 ENCOUNTER — Ambulatory Visit
Admission: RE | Admit: 2017-05-08 | Discharge: 2017-05-08 | Disposition: A | Discharge status: Home / Self Care | Payer: Worker's Compensation | Source: Ambulatory Visit | Attending: General Surgery | Admitting: General Surgery

## 2017-05-08 ENCOUNTER — Other Ambulatory Visit: Payer: Self-pay | Admitting: General Surgery

## 2017-05-08 DIAGNOSIS — Z01811 Encounter for preprocedural respiratory examination: Secondary | ICD-10-CM

## 2017-05-08 DIAGNOSIS — Z01818 Encounter for other preprocedural examination: Secondary | ICD-10-CM | POA: Insufficient documentation

## 2017-05-08 DIAGNOSIS — K4090 Unilateral inguinal hernia, without obstruction or gangrene, not specified as recurrent: Secondary | ICD-10-CM | POA: Diagnosis not present

## 2017-05-08 DIAGNOSIS — Z01810 Encounter for preprocedural cardiovascular examination: Secondary | ICD-10-CM | POA: Diagnosis not present

## 2017-05-08 LAB — CBC WITH DIFFERENTIAL/PLATELET
Basophils Absolute: 0.1 10*3/uL (ref 0.0–0.1)
Basophils Relative: 2 %
EOS ABS: 0.2 10*3/uL (ref 0.0–0.7)
Eosinophils Relative: 4 %
HCT: 44 % (ref 39.0–52.0)
Hemoglobin: 14.6 g/dL (ref 13.0–17.0)
Lymphocytes Relative: 36 %
Lymphs Abs: 2.2 10*3/uL (ref 0.7–4.0)
MCH: 29.1 pg (ref 26.0–34.0)
MCHC: 33.2 g/dL (ref 30.0–36.0)
MCV: 87.6 fL (ref 78.0–100.0)
MONO ABS: 0.7 10*3/uL (ref 0.1–1.0)
Monocytes Relative: 11 %
NEUTROS PCT: 47 %
Neutro Abs: 3 10*3/uL (ref 1.7–7.7)
PLATELETS: 184 10*3/uL (ref 150–400)
RBC: 5.02 MIL/uL (ref 4.22–5.81)
RDW: 13.4 % (ref 11.5–15.5)
WBC: 6.2 10*3/uL (ref 4.0–10.5)

## 2017-05-08 LAB — BASIC METABOLIC PANEL
Anion gap: 11 (ref 5–15)
BUN: 16 mg/dL (ref 6–20)
CALCIUM: 9.4 mg/dL (ref 8.9–10.3)
CO2: 24 mmol/L (ref 22–32)
CREATININE: 0.85 mg/dL (ref 0.61–1.24)
Chloride: 104 mmol/L (ref 101–111)
GFR calc non Af Amer: >60 mL/min (ref >60)
Glucose, Bld: 91 mg/dL (ref 65–99)
Potassium: 4.9 mmol/L (ref 3.5–5.1)
SODIUM: 139 mmol/L (ref 135–145)

## 2017-05-08 NOTE — Progress Notes (Signed)
Ensure pre surgery drink given with instructions to complete by 0515 dos, pt verbalized understanding. 

## 2017-05-14 ENCOUNTER — Ambulatory Visit (HOSPITAL_BASED_OUTPATIENT_CLINIC_OR_DEPARTMENT_OTHER): Payer: Worker's Compensation | Admitting: Certified Registered"

## 2017-05-14 ENCOUNTER — Encounter (HOSPITAL_BASED_OUTPATIENT_CLINIC_OR_DEPARTMENT_OTHER): Payer: Self-pay | Admitting: Certified Registered"

## 2017-05-14 ENCOUNTER — Encounter (HOSPITAL_BASED_OUTPATIENT_CLINIC_OR_DEPARTMENT_OTHER)
Admission: RE | Disposition: A | Discharge status: Home / Self Care | Payer: Self-pay | Source: Ambulatory Visit | Attending: General Surgery

## 2017-05-14 ENCOUNTER — Ambulatory Visit (HOSPITAL_BASED_OUTPATIENT_CLINIC_OR_DEPARTMENT_OTHER)
Admission: RE | Admit: 2017-05-14 | Discharge: 2017-05-14 | Disposition: A | Discharge status: Home / Self Care | Payer: Worker's Compensation | Source: Ambulatory Visit | Attending: General Surgery | Admitting: General Surgery

## 2017-05-14 DIAGNOSIS — Z79899 Other long term (current) drug therapy: Secondary | ICD-10-CM | POA: Diagnosis not present

## 2017-05-14 DIAGNOSIS — Z87891 Personal history of nicotine dependence: Secondary | ICD-10-CM | POA: Diagnosis not present

## 2017-05-14 DIAGNOSIS — D176 Benign lipomatous neoplasm of spermatic cord: Secondary | ICD-10-CM | POA: Insufficient documentation

## 2017-05-14 DIAGNOSIS — K4090 Unilateral inguinal hernia, without obstruction or gangrene, not specified as recurrent: Secondary | ICD-10-CM | POA: Insufficient documentation

## 2017-05-14 HISTORY — PX: INSERTION OF MESH: SHX5868

## 2017-05-14 HISTORY — DX: Unilateral inguinal hernia, without obstruction or gangrene, not specified as recurrent: K40.90

## 2017-05-14 HISTORY — PX: INGUINAL HERNIA REPAIR: SHX194

## 2017-05-14 SURGERY — REPAIR, HERNIA, INGUINAL, ADULT
Anesthesia: General | Site: Groin | Laterality: Right

## 2017-05-14 MED ORDER — HYDROMORPHONE HCL 1 MG/ML IJ SOLN
INTRAMUSCULAR | Status: AC
  Filled 2017-05-14: qty 0.5

## 2017-05-14 MED ORDER — BUPIVACAINE-EPINEPHRINE (PF) 0.5% -1:200000 IJ SOLN
INTRAMUSCULAR | Status: DC | PRN
  Administered 2017-05-14: 30 mL

## 2017-05-14 MED ORDER — DEXAMETHASONE SODIUM PHOSPHATE 4 MG/ML IJ SOLN
INTRAMUSCULAR | Status: DC | PRN
  Administered 2017-05-14: 10 mg via INTRAVENOUS

## 2017-05-14 MED ORDER — CEFAZOLIN SODIUM-DEXTROSE 2-4 GM/100ML-% IV SOLN
INTRAVENOUS | Status: AC
  Filled 2017-05-14: qty 200

## 2017-05-14 MED ORDER — LIDOCAINE HCL (CARDIAC) 20 MG/ML IV SOLN
INTRAVENOUS | Status: DC | PRN
  Administered 2017-05-14: 30 mg via INTRAVENOUS

## 2017-05-14 MED ORDER — ONDANSETRON HCL 4 MG/2ML IJ SOLN
INTRAMUSCULAR | Status: DC | PRN
  Administered 2017-05-14: 4 mg via INTRAVENOUS

## 2017-05-14 MED ORDER — ACETAMINOPHEN 500 MG PO TABS
1000.0000 mg | ORAL_TABLET | ORAL | Status: AC
  Administered 2017-05-14: 1000 mg via ORAL

## 2017-05-14 MED ORDER — PROPOFOL 10 MG/ML IV BOLUS
INTRAVENOUS | Status: DC | PRN
  Administered 2017-05-14: 200 mg via INTRAVENOUS

## 2017-05-14 MED ORDER — FENTANYL CITRATE (PF) 100 MCG/2ML IJ SOLN
INTRAMUSCULAR | Status: AC
  Filled 2017-05-14: qty 2

## 2017-05-14 MED ORDER — SODIUM CHLORIDE 0.9 % IV SOLN
INTRAVENOUS | Status: DC | PRN
  Administered 2017-05-14: 500 mL

## 2017-05-14 MED ORDER — ACETAMINOPHEN 500 MG PO TABS
ORAL_TABLET | ORAL | Status: AC
  Filled 2017-05-14: qty 2

## 2017-05-14 MED ORDER — OXYCODONE HCL 5 MG PO TABS: 5.0000 mg | ORAL_TABLET | Freq: Four times a day (QID) | ORAL | 0 refills | Status: DC | PRN

## 2017-05-14 MED ORDER — CHLORHEXIDINE GLUCONATE CLOTH 2 % EX PADS: 6.0000 | MEDICATED_PAD | Freq: Once | CUTANEOUS | Status: DC

## 2017-05-14 MED ORDER — LACTATED RINGERS IV SOLN
INTRAVENOUS | Status: DC
  Administered 2017-05-14 (×4): via INTRAVENOUS

## 2017-05-14 MED ORDER — GABAPENTIN 300 MG PO CAPS
300.0000 mg | ORAL_CAPSULE | ORAL | Status: AC
  Administered 2017-05-14: 300 mg via ORAL

## 2017-05-14 MED ORDER — FENTANYL CITRATE (PF) 100 MCG/2ML IJ SOLN
50.0000 ug | INTRAMUSCULAR | Status: AC | PRN
  Administered 2017-05-14: 25 ug via INTRAVENOUS
  Administered 2017-05-14: 100 ug via INTRAVENOUS
  Administered 2017-05-14: 50 ug via INTRAVENOUS
  Administered 2017-05-14: 25 ug via INTRAVENOUS

## 2017-05-14 MED ORDER — PROMETHAZINE HCL 25 MG/ML IJ SOLN: 6.2500 mg | INTRAMUSCULAR | Status: DC | PRN

## 2017-05-14 MED ORDER — GABAPENTIN 300 MG PO CAPS
ORAL_CAPSULE | ORAL | Status: AC
  Filled 2017-05-14: qty 1

## 2017-05-14 MED ORDER — BUPIVACAINE LIPOSOME 1.3 % IJ SUSP
INTRAMUSCULAR | Status: AC
  Filled 2017-05-14: qty 20

## 2017-05-14 MED ORDER — HYDROMORPHONE HCL 1 MG/ML IJ SOLN
0.2500 mg | INTRAMUSCULAR | Status: DC | PRN
  Administered 2017-05-14: 0.5 mg via INTRAVENOUS

## 2017-05-14 MED ORDER — MIDAZOLAM HCL 2 MG/2ML IJ SOLN
INTRAMUSCULAR | Status: AC
  Filled 2017-05-14: qty 2

## 2017-05-14 MED ORDER — SCOPOLAMINE 1 MG/3DAYS TD PT72: 1.0000 | MEDICATED_PATCH | Freq: Once | TRANSDERMAL | Status: DC | PRN

## 2017-05-14 MED ORDER — MIDAZOLAM HCL 2 MG/2ML IJ SOLN
1.0000 mg | INTRAMUSCULAR | Status: DC | PRN
  Administered 2017-05-14: 2 mg via INTRAVENOUS

## 2017-05-14 MED ORDER — BUPIVACAINE LIPOSOME 1.3 % IJ SUSP
INTRAMUSCULAR | Status: DC | PRN
  Administered 2017-05-14: 20 mL

## 2017-05-14 MED ORDER — DEXTROSE 5 % IV SOLN
3.0000 g | INTRAVENOUS | Status: AC
  Administered 2017-05-14: 3 g via INTRAVENOUS

## 2017-05-14 SURGICAL SUPPLY — 52 items
BAG DECANTER FOR FLEXI CONT (MISCELLANEOUS) ×3 IMPLANT
BLADE CLIPPER SURG (BLADE) ×3 IMPLANT
BLADE SURG 10 STRL SS (BLADE) ×3 IMPLANT
BLADE SURG 15 STRL LF DISP TIS (BLADE) ×2 IMPLANT
BLADE SURG 15 STRL SS (BLADE) ×4
CANISTER SUCT 1200ML W/VALVE (MISCELLANEOUS) IMPLANT
CHLORAPREP W/TINT 26ML (MISCELLANEOUS) ×3 IMPLANT
CLEANER CAUTERY TIP 5X5 PAD (MISCELLANEOUS) ×1 IMPLANT
CLOSURE WOUND 1/2 X4 (GAUZE/BANDAGES/DRESSINGS) ×1
COVER BACK TABLE 60X90IN (DRAPES) ×3 IMPLANT
COVER MAYO STAND STRL (DRAPES) ×3 IMPLANT
DECANTER SPIKE VIAL GLASS SM (MISCELLANEOUS) ×3 IMPLANT
DERMABOND ADVANCED (GAUZE/BANDAGES/DRESSINGS) ×2
DERMABOND ADVANCED .7 DNX12 (GAUZE/BANDAGES/DRESSINGS) ×1 IMPLANT
DRAIN PENROSE 1/2X12 LTX STRL (WOUND CARE) ×3 IMPLANT
DRAPE LAPAROTOMY TRNSV 102X78 (DRAPE) ×3 IMPLANT
DRAPE UTILITY XL STRL (DRAPES) ×3 IMPLANT
DRSG TEGADERM 2-3/8X2-3/4 SM (GAUZE/BANDAGES/DRESSINGS) IMPLANT
DRSG TEGADERM 4X4.75 (GAUZE/BANDAGES/DRESSINGS) ×3 IMPLANT
ELECT REM PT RETURN 9FT ADLT (ELECTROSURGICAL) ×3
ELECTRODE REM PT RTRN 9FT ADLT (ELECTROSURGICAL) ×1 IMPLANT
GLOVE BIOGEL PI IND STRL 8 (GLOVE) ×1 IMPLANT
GLOVE BIOGEL PI INDICATOR 8 (GLOVE) ×2
GLOVE ECLIPSE 7.5 STRL STRAW (GLOVE) ×3 IMPLANT
GOWN STRL REUS W/ TWL LRG LVL3 (GOWN DISPOSABLE) ×1 IMPLANT
GOWN STRL REUS W/ TWL XL LVL3 (GOWN DISPOSABLE) ×1 IMPLANT
GOWN STRL REUS W/TWL LRG LVL3 (GOWN DISPOSABLE) ×2
GOWN STRL REUS W/TWL XL LVL3 (GOWN DISPOSABLE) ×2
MESH HERNIA 3X6 (Mesh General) ×3 IMPLANT
NEEDLE HYPO 25X1 1.5 SAFETY (NEEDLE) ×3 IMPLANT
NS IRRIG 1000ML POUR BTL (IV SOLUTION) IMPLANT
PACK BASIN DAY SURGERY FS (CUSTOM PROCEDURE TRAY) ×3 IMPLANT
PAD CLEANER CAUTERY TIP 5X5 (MISCELLANEOUS) ×2
PENCIL BUTTON HOLSTER BLD 10FT (ELECTRODE) ×3 IMPLANT
SLEEVE SCD COMPRESS KNEE MED (MISCELLANEOUS) ×3 IMPLANT
SPONGE INTESTINAL PEANUT (DISPOSABLE) ×3 IMPLANT
SPONGE LAP 4X18 X RAY DECT (DISPOSABLE) ×3 IMPLANT
STRIP CLOSURE SKIN 1/2X4 (GAUZE/BANDAGES/DRESSINGS) ×2 IMPLANT
SUT ETHIBOND 0 MO6 C/R (SUTURE) ×3 IMPLANT
SUT MON AB 4-0 PC3 18 (SUTURE) ×3 IMPLANT
SUT PROLENE 0 CT 2 (SUTURE) ×6 IMPLANT
SUT VIC AB 3-0 SH 27 (SUTURE) ×4
SUT VIC AB 3-0 SH 27X BRD (SUTURE) ×2 IMPLANT
SUT VICRYL 4-0 PS2 18IN ABS (SUTURE) IMPLANT
SUT VICRYL AB 3 0 TIES (SUTURE) ×3 IMPLANT
SYR BULB 3OZ (MISCELLANEOUS) ×3 IMPLANT
SYR CONTROL 10ML LL (SYRINGE) ×3 IMPLANT
TOWEL OR 17X24 6PK STRL BLUE (TOWEL DISPOSABLE) ×3 IMPLANT
TOWEL OR NON WOVEN STRL DISP B (DISPOSABLE) ×3 IMPLANT
TUBE CONNECTING 20'X1/4 (TUBING)
TUBE CONNECTING 20X1/4 (TUBING) IMPLANT
YANKAUER SUCT BULB TIP NO VENT (SUCTIONS) IMPLANT

## 2017-05-14 NOTE — Op Note (Signed)
OPERATIVE REPORT  DATE OF OPERATION: 05/14/2017  PATIENT:  Jordan Reynolds  62 y.o. male  PRE-OPERATIVE DIAGNOSIS:  SYMPTOMATIC RIGHT INGUINAL HERNIA  POST-OPERATIVE DIAGNOSIS:  SYMPTOMATIC RIGHT INGUINAL HERNIA  INDICATION(S) FOR OPERATION: Symptomatic right inguinal hernia  FINDINGS: Large direct right inguinal hernia with a large lipoma of the cord.  PROCEDURE:  Procedure(s): OPEN RIGHT INGUINAL HERNIA REPAIR WITH MESH INSERTION OF MESH  SURGEON:  Surgeon(s): Judeth Horn, MD  ASSISTANT: None  ANESTHESIA:   general  COMPLICATIONS: None  EBL: Less than 20 ml  BLOOD ADMINISTERED: none  DRAINS: none   SPECIMEN:  No Specimen  COUNTS CORRECT:  YES  PROCEDURE DETAILS: The patient was taken to the operating room and placed on the table in the supine position.  After an adequate general laryngeal airway anesthetic was administered, he was prepped and draped in usual sterile manner exposing his right inguinal area.  A proper timeout was performed identifying the patient and procedure to be performed.  We marked the area of the incision and above the area of the superficial ring a transverse curvilinear incision was made and taken down to subcutaneous tissue.  2 large subcutaneous veins were ligated and transected.  We dissected down to the fascia of the external oblique was subsequently opened the external oblique fascia down through the superficial ring.  This helped expose the large hernia along with the spermatic cord which was cleaned mobilized with a Penrose drain.  Once we had the spermatic cord up on a work bench we were able to dissect free a large lipoma of the cord which went down distally and subsequently dissected away the large direct sac that came along the medial aspect of the cord.  Once we mobilized and dissected away the direct sac from the base of the spermatic cord we were able to imbricated on itself using interrupted 0 Ethibond suture.  Once this was done we  placed an oval piece of polypropylene mesh measuring 5 x 3 cm in size attaching it to the conjoined tendon and the reflected portion of the inguinal ligament using running 0 Prolene suture.  We irrigated the mesh with antibiotic solution and which would have been soaked prior to being implanted.  Once we did place the mesh in the wound we allowed the spermatic cord to fall back into the inguinal canal and was subsequently reapproximated the external oblique patch on top of the cord using a running 3-0 Vicryl suture.  We then reapproximated Scarpa's fascia using interrupted 2-0 Vicryl suture.  We injected 20 cc of Exparel L into the subcutaneous tissue.  We then closed the skin using running subcuticular stitch of 4-0 Monocryl.  A sterile dressing was applied including Dermabond Steri-Strips intact.  All needle counts, sponge counts, and instrument counts were correct.  PATIENT DISPOSITION:  PACU - hemodynamically stable.   Judeth Horn 2/11/201910:51 AM

## 2017-05-14 NOTE — Progress Notes (Signed)
AssistedDr. Rob Fitzgerald with right, ultrasound guided, transabdominal plane block. Side rails up, monitors on throughout procedure. See vital signs in flow sheet. Tolerated Procedure well.  

## 2017-05-14 NOTE — H&P (Signed)
    [place] Documented: 05/03/2017 2:48 PM Location: [facility] Patient #: [id number] DOB: 05-09-1955 Married / Language: Cleophus Molt / Race: White Male   History of Present Illness Kathryne Eriksson. Hulen Skains MD; 05/03/2017 3:04 PM) The patient is a 62 year old male who presents with an inguinal hernia. The hernia(s) is/are located on the right side. Symptoms include inguinal bulge, scrotal mass and inguinal pain. The pain is located in the right inguinal area, in the right hemiscrotum and in the right lower quadrant. The pain radiates to the right hemiscrotum. The patient describes the pain as sharp. Onset was sudden 3 week(s) ago. Onset followed lifting. The symptoms occur constantly. The patient describes this as moderate in severity and worsening. Symptoms are exacerbated by straining, lifting and coughing. Symptoms are relieved by recumbency.   Allergies Levonne Spiller, CMA; 05/03/2017 2:49 PM) No Known Drug Allergies [05/03/2017]: Allergies Reconciled   Medication History Levonne Spiller, CMA; 05/03/2017 2:50 PM) TraMADol HCl (50MG  Tablet, Oral) Active. Multiple Vitamin (Oral) Specific strength unknown - Active. Vitamin B-12 (1000MCG Tablet, Oral) Active. Glucosamine Chondroitin Adv (Oral) Specific strength unknown - Active. Medications Reconciled  Vitals (Danielle Gerrigner CMA; 05/03/2017 2:51 PM) 05/03/2017 2:50 PM Weight: 298.5 lb Height: 73in Body Surface Area: 2.55 m Body Mass Index: 39.38 kg/m  Temp.: 98.36F(Oral)  Pulse: 91 (Regular)  BP: 130/86 (Sitting, Left Arm, Standard)  Today 130/85, P 64     Physical Exam (Jaylin Roundy O. Hulen Skains MD; 05/03/2017 3:08 PM) Chest and Lung Exam Chest and lung exam reveals -normal excursion with symmetric chest walls, normal resonance, no flatness or dullness, non-tender and normal tactile fremitus and on auscultation, normal breath sounds, no adventitious sounds and normal vocal  resonance.  Cardiovascular Cardiovascular examination reveals -on palpation PMI is normal in location and amplitude, no palpable S3 or S4. Normal cardiac borders., normal heart sounds, regular rate and rhythm with no murmurs and femoral artery auscultation bilaterally reveals normal pulses, no bruits, no thrills.  Abdomen Inspection Hernias - Inguinal hernia - Right - Reducible(Seems to be direct. ?Easily reducible. No left hernia palpated. This occurred during heavy lifting.).    Assessment & Plan Jeneen Rinks O. 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 January 3 exactly Impression: Symptomatic likely direct RIH. Open repair recommended because of the patients anatomy and size, concerned will not be able to get adequate space for laparoscopy. Current Plans:  Open RIH repair with mesh Patient and his wife understand the risks and benefits.  Kathryne Eriksson. Dahlia Bailiff, MD, [place] [phone] [facility]

## 2017-05-14 NOTE — Anesthesia Postprocedure Evaluation (Signed)
Anesthesia Post Note  Patient: Jordan Reynolds  Procedure(s) Performed: OPEN RIGHT INGUINAL HERNIA REPAIR WITH MESH (Right Groin) INSERTION OF MESH (Right Groin)     Patient location during evaluation: PACU Anesthesia Type: General Level of consciousness: awake and alert Pain management: pain level controlled Vital Signs Assessment: post-procedure vital signs reviewed and stable Respiratory status: spontaneous breathing, nonlabored ventilation, respiratory function stable and patient connected to nasal cannula oxygen Cardiovascular status: blood pressure returned to baseline and stable Postop Assessment: no apparent nausea or vomiting Anesthetic complications: no    Last Vitals:  Vitals:   05/14/17 1145 05/14/17 1215  BP: 128/89 130/88  Pulse: 66   Resp: 14 16  Temp:  36.6 C  SpO2: 97%     Last Pain:  Vitals:   05/14/17 1215  PainSc: 2                  Tiajuana Amass

## 2017-05-14 NOTE — Anesthesia Preprocedure Evaluation (Addendum)
Anesthesia Evaluation  Patient identified by MRN, date of birth, ID band Patient awake    Reviewed: Allergy & Precautions, NPO status , Patient's Chart, lab work & pertinent test results  Airway Mallampati: II  TM Distance: >3 FB Neck ROM: Full    Dental  (+) Dental Advisory Given   Pulmonary former smoker,    Pulmonary exam normal        Cardiovascular negative cardio ROS   Rhythm:Regular Rate:Normal     Neuro/Psych negative neurological ROS     GI/Hepatic negative GI ROS, Neg liver ROS,   Endo/Other  negative endocrine ROS  Renal/GU negative Renal ROS     Musculoskeletal   Abdominal (+) + obese,   Peds  Hematology negative hematology ROS (+)   Anesthesia Other Findings   Reproductive/Obstetrics                            Lab Results  Component Value Date   WBC 6.2 05/08/2017   HGB 14.6 05/08/2017   HCT 44.0 05/08/2017   MCV 87.6 05/08/2017   PLT 184 05/08/2017   Lab Results  Component Value Date   CREATININE 0.85 05/08/2017   BUN 16 05/08/2017   NA 139 05/08/2017   K 4.9 05/08/2017   CL 104 05/08/2017   CO2 24 05/08/2017    Anesthesia Physical Anesthesia Plan  ASA: II  Anesthesia Plan: General   Post-op Pain Management:  Regional for Post-op pain   Induction: Intravenous  PONV Risk Score and Plan: 2 and Ondansetron, Dexamethasone and Treatment may vary due to age or medical condition  Airway Management Planned: LMA  Additional Equipment:   Intra-op Plan:   Post-operative Plan: Extubation in OR  Informed Consent: I have reviewed the patients History and Physical, chart, labs and discussed the procedure including the risks, benefits and alternatives for the proposed anesthesia with the patient or authorized representative who has indicated his/her understanding and acceptance.     Plan Discussed with:   Anesthesia Plan Comments:         Anesthesia  Quick Evaluation

## 2017-05-14 NOTE — Transfer of Care (Signed)
Immediate Anesthesia Transfer of Care Note  Patient: Jordan Reynolds  Procedure(s) Performed: OPEN RIGHT INGUINAL HERNIA REPAIR WITH MESH (Right Groin) INSERTION OF MESH (Right Groin)  Patient Location: PACU  Anesthesia Type:GA combined with regional for post-op pain  Level of Consciousness: awake and patient cooperative  Airway & Oxygen Therapy: Patient Spontanous Breathing and Patient connected to face mask oxygen  Post-op Assessment: Report given to RN and Post -op Vital signs reviewed and stable  Post vital signs: Reviewed and stable  Last Vitals:  Vitals:   05/14/17 0850 05/14/17 0855  BP: 128/84 130/85  Pulse: 65 64  Resp: 12 12  SpO2: 100% 100%    Last Pain: There were no vitals filed for this visit.       Complications: No apparent anesthesia complications

## 2017-05-14 NOTE — Discharge Instructions (Addendum)
Open Hernia Repair, Adult, Care After These instructions give you information about caring for yourself after your procedure. Your doctor may also give you more specific instructions. If you have problems or questions, contact your doctor. Follow these instructions at home: Surgical cut (incision) care   Follow instructions from your doctor about how to take care of your surgical cut area. Make sure you: ? Wash your hands with soap and water before you change your bandage (dressing). If you cannot use soap and water, use hand sanitizer. ? Change your bandage as told by your doctor. ? Leave stitches (sutures), skin glue, or skin tape (adhesive) strips in place. They may need to stay in place for 2 weeks or longer. If tape strips get loose and curl up, you may trim the loose edges. Do not remove tape strips completely unless your doctor says it is okay.  Check your surgical cut every day for signs of infection. Check for: ? More redness, swelling, or pain. ? More fluid or blood. ? Warmth. ? Pus or a bad smell. Activity  Do not drive or use heavy machinery while taking prescription pain medicine. Do not drive until your doctor says it is okay.  Until your doctor says it is okay: ? Do not lift anything that is heavier than 10 lb (4.5 kg). ? Do not play contact sports.  Return to your normal activities as told by your doctor. Ask your doctor what activities are safe. General instructions  To prevent or treat having a hard time pooping (constipation) while you are taking prescription pain medicine, your doctor may recommend that you: ? Drink enough fluid to keep your pee (urine) clear or pale yellow. ? Take over-the-counter or prescription medicines. ? Eat foods that are high in fiber, such as fresh fruits and vegetables, whole grains, and beans. ? Limit foods that are high in fat and processed sugars, such as fried and sweet foods.  Take over-the-counter and prescription medicines only as  told by your doctor.  Do not take baths, swim, or use a hot tub until your doctor says it is okay.  Keep all follow-up visits as told by your doctor. This is important. Contact a doctor if:  You develop a rash.  You have more redness, swelling, or pain around your surgical cut.  You have more fluid or blood coming from your surgical cut.  Your surgical cut feels warm to the touch.  You have pus or a bad smell coming from your surgical cut.  You have a fever or chills.  You have blood in your poop (stool).  You have not pooped in 2-3 days.  Medicine does not help your pain.  Tense inguinal or scrotal pain or large hematoma Get help right away if:  You have chest pain or you are short of breath.  You feel light-headed.  You feel weak and dizzy (feel faint).  You have very bad pain.  You throw up (vomit) and your pain is worse. This information is not intended to replace advice given to you by your health care provider. Make sure you discuss any questions you have with your health care provider.  Kathryne Eriksson. Dahlia Bailiff, MD, [place] [phone] Surgery    Post Anesthesia Home Care Instructions  Activity: Get plenty of rest for the remainder of the day. A responsible individual must stay with you for 24 hours following the procedure.  For the next 24 hours, DO NOT: -Drive a car Film/video editor -  Drink alcoholic beverages -Take any medication unless instructed by your physician -Make any legal decisions or sign important papers.  Meals: Start with liquid foods such as gelatin or soup. Progress to regular foods as tolerated. Avoid greasy, spicy, heavy foods. If nausea and/or vomiting occur, drink only clear liquids until the nausea and/or vomiting subsides. Call your physician if vomiting continues.  Special Instructions/Symptoms: Your throat may feel dry or sore from the anesthesia or the breathing tube placed in your  throat during surgery. If this causes discomfort, gargle with warm salt water. The discomfort should disappear within 24 hours.  If you had a scopolamine patch placed behind your ear for the management of post- operative nausea and/or vomiting:  1. The medication in the patch is effective for 72 hours, after which it should be removed.  Wrap patch in a tissue and discard in the trash. Wash hands thoroughly with soap and water. 2. You may remove the patch earlier than 72 hours if you experience unpleasant side effects which may include dry mouth, dizziness or visual disturbances. 3. Avoid touching the patch. Wash your hands with soap and water after contact with the patch.

## 2017-05-14 NOTE — Anesthesia Procedure Notes (Signed)
Anesthesia Regional Block: TAP block   Pre-Anesthetic Checklist: ,, timeout performed, Correct Patient, Correct Site, Correct Laterality, Correct Procedure, Correct Position, site marked, Risks and benefits discussed,  Surgical consent,  Pre-op evaluation,  At surgeon's request and post-op pain management  Laterality: Right  Prep: chloraprep       Needles:  Injection technique: Single-shot  Needle Type: Echogenic Needle     Needle Length: 9cm  Needle Gauge: 21     Additional Needles:   Procedures:,,,, ultrasound used (permanent image in chart),,,,  Narrative:  Start time: 05/14/2017 8:40 AM End time: 05/14/2017 8:48 AM Injection made incrementally with aspirations every 5 mL.  Performed by: Personally  Anesthesiologist: Suzette Battiest, MD

## 2017-05-14 NOTE — Anesthesia Procedure Notes (Signed)
Procedure Name: LMA Insertion Date/Time: 05/14/2017 9:19 AM Performed by: Signe Colt, CRNA Pre-anesthesia Checklist: Patient identified, Emergency Drugs available, Suction available and Patient being monitored Patient Re-evaluated:Patient Re-evaluated prior to induction Oxygen Delivery Method: Circle system utilized Preoxygenation: Pre-oxygenation with 100% oxygen Induction Type: IV induction Ventilation: Mask ventilation without difficulty LMA: LMA inserted LMA Size: 5.0 Number of attempts: 1 Airway Equipment and Method: Bite block Placement Confirmation: positive ETCO2 Tube secured with: Tape Dental Injury: Teeth and Oropharynx as per pre-operative assessment

## 2017-05-15 ENCOUNTER — Encounter (HOSPITAL_BASED_OUTPATIENT_CLINIC_OR_DEPARTMENT_OTHER): Payer: Self-pay | Admitting: General Surgery

## 2017-10-25 ENCOUNTER — Encounter: Payer: Self-pay | Admitting: Family Medicine

## 2017-10-25 ENCOUNTER — Other Ambulatory Visit: Payer: Self-pay | Admitting: Family Medicine

## 2017-10-25 DIAGNOSIS — Z125 Encounter for screening for malignant neoplasm of prostate: Secondary | ICD-10-CM

## 2017-10-25 DIAGNOSIS — E7800 Pure hypercholesterolemia, unspecified: Secondary | ICD-10-CM

## 2017-10-25 DIAGNOSIS — R7303 Prediabetes: Secondary | ICD-10-CM

## 2017-10-26 ENCOUNTER — Other Ambulatory Visit (INDEPENDENT_AMBULATORY_CARE_PROVIDER_SITE_OTHER): Payer: Commercial Managed Care - PPO

## 2017-10-26 DIAGNOSIS — Z125 Encounter for screening for malignant neoplasm of prostate: Secondary | ICD-10-CM | POA: Diagnosis not present

## 2017-10-26 DIAGNOSIS — R7303 Prediabetes: Secondary | ICD-10-CM

## 2017-10-26 DIAGNOSIS — E7800 Pure hypercholesterolemia, unspecified: Secondary | ICD-10-CM

## 2017-10-26 LAB — COMPREHENSIVE METABOLIC PANEL
ALBUMIN: 4.7 g/dL (ref 3.5–5.2)
ALK PHOS: 44 U/L (ref 39–117)
ALT: 33 U/L (ref 0–53)
AST: 29 U/L (ref 0–37)
BILIRUBIN TOTAL: 0.9 mg/dL (ref 0.2–1.2)
BUN: 20 mg/dL (ref 6–23)
CALCIUM: 9.5 mg/dL (ref 8.4–10.5)
CO2: 27 mEq/L (ref 19–32)
Chloride: 104 mEq/L (ref 96–112)
Creatinine, Ser: 1.06 mg/dL (ref 0.40–1.50)
GFR: 75.65 mL/min (ref >60.00)
GLUCOSE: 103 mg/dL — AB (ref 70–99)
POTASSIUM: 4.6 meq/L (ref 3.5–5.1)
Sodium: 139 mEq/L (ref 135–145)
TOTAL PROTEIN: 7 g/dL (ref 6.0–8.3)

## 2017-10-26 LAB — LIPID PANEL
CHOLESTEROL: 174 mg/dL (ref 0–200)
HDL: 50.2 mg/dL (ref >39.00)
LDL Cholesterol: 106 mg/dL — ABNORMAL HIGH (ref 0–99)
NonHDL: 124.01
TRIGLYCERIDES: 90 mg/dL (ref 0.0–149.0)
Total CHOL/HDL Ratio: 3
VLDL: 18 mg/dL (ref 0.0–40.0)

## 2017-10-26 LAB — HEMOGLOBIN A1C: HEMOGLOBIN A1C: 5.9 % (ref 4.6–6.5)

## 2017-10-26 LAB — PSA: PSA: 0.69 ng/mL (ref 0.10–4.00)

## 2017-10-30 ENCOUNTER — Encounter: Payer: Commercial Managed Care - PPO | Admitting: Family Medicine

## 2017-10-30 DIAGNOSIS — Z0289 Encounter for other administrative examinations: Secondary | ICD-10-CM

## 2017-12-12 ENCOUNTER — Encounter: Payer: Self-pay | Admitting: Family Medicine

## 2017-12-12 ENCOUNTER — Ambulatory Visit (INDEPENDENT_AMBULATORY_CARE_PROVIDER_SITE_OTHER): Payer: Commercial Managed Care - PPO | Admitting: Family Medicine

## 2017-12-12 VITALS — BP 120/82 | HR 97 | Temp 97.9°F | Ht 72.0 in | Wt 270.2 lb

## 2017-12-12 DIAGNOSIS — R7303 Prediabetes: Secondary | ICD-10-CM | POA: Diagnosis not present

## 2017-12-12 DIAGNOSIS — Z0000 Encounter for general adult medical examination without abnormal findings: Secondary | ICD-10-CM | POA: Diagnosis not present

## 2017-12-12 DIAGNOSIS — E7800 Pure hypercholesterolemia, unspecified: Secondary | ICD-10-CM | POA: Diagnosis not present

## 2017-12-12 DIAGNOSIS — E669 Obesity, unspecified: Secondary | ICD-10-CM | POA: Diagnosis not present

## 2017-12-12 DIAGNOSIS — Z23 Encounter for immunization: Secondary | ICD-10-CM

## 2017-12-12 NOTE — Assessment & Plan Note (Signed)
Preventative protocols reviewed and updated unless pt declined. Discussed healthy diet and lifestyle.  

## 2017-12-12 NOTE — Addendum Note (Signed)
Addended by: Brenton Grills on: 10/19/5972 71:85 PM   Modules accepted: Orders

## 2017-12-12 NOTE — Progress Notes (Addendum)
BP 120/82 (BP Location: Left Arm, Patient Position: Sitting, Cuff Size: Large)   Pulse 97   Temp 97.9 F (36.6 C) (Oral)   Ht 6' (1.829 m)   Wt 270 lb 4 oz (122.6 kg)   SpO2 95%   BMI 36.65 kg/m    CC: CPE  Subjective:    Patient ID: Jordan Reynolds, male    DOB: [birth year], 62 y.o.   MRN: [id number]  HPI: Jordan Reynolds is a 62 y.o. male presenting on 12/12/2017 for Annual Exam   Joined [facility]" program this year. 15 lb weight loss! Motivated to continue these healthy changes.   Preventative: COLONOSCOPY Date: 03/2015 TA, rpt 5 yrs Ardis Hughs)  Prostate cancer screening - father with h/o prostate cancer - requests checked yearly.  Lung cancer screening - not eligible - quit >15 yrs ago  Flu - doesn't receive Td 2009, Tdap 10/2012 shingrix - discussed Seat belt use discussed Sunscreen use discussed. No changing moles.  Ex smoker - quit 1998, 30+ PY hx.  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 Interim medical history since our last visit reviewed. Allergies and medications reviewed and updated. Outpatient Medications Prior to Visit  Medication Sig Dispense Refill  . Ascorbic Acid (VITAMIN C PO) Take by mouth. Takes 5,000 mg powder (1 teaspoon)    . Methylsulfonylmethane (MSM) POWD Take by mouth. Takes 10,000 mg daily    . Multiple Vitamin (MULTIVITAMIN) tablet Take 1 tablet by mouth daily.    . Ascorbic Acid (VITAMIN C) 1000 MG tablet Take 1,000 mg by mouth daily.    Marland Kitchen oxyCODONE (OXY IR/ROXICODONE) 5 MG immediate release tablet Take 1-2 tablets (5-10 mg total) by mouth  every 6 (six) hours as needed for severe pain. 30 tablet 0  . traMADol (ULTRAM) 50 MG tablet Take 1 tablet (50 mg total) by mouth every 6 (six) hours as needed. 10 tablet 0  . Glucosamine-Chondroitin (GLUCOSAMINE CHONDR COMPLEX PO) Take by mouth.    . vitamin B-12 (CYANOCOBALAMIN) 1000 MCG tablet Take 1,000 mcg by mouth daily.     No facility-administered medications prior to visit.      Per HPI unless specifically indicated in ROS section below Review of Systems  Constitutional: Negative for activity change, appetite change, chills, fatigue, fever and unexpected weight change.  HENT: Negative for hearing loss.   Eyes: Negative for visual disturbance.  Respiratory: Negative for cough, chest tightness, shortness of breath and wheezing.   Cardiovascular: Negative for chest pain, palpitations and leg swelling.  Gastrointestinal: Negative for abdominal distention, abdominal pain, blood in stool, constipation, diarrhea, nausea and vomiting.  Genitourinary: Negative for difficulty urinating and hematuria.  Musculoskeletal: Negative for arthralgias, myalgias and neck pain.  Skin: Negative for rash.  Neurological: Negative for dizziness, seizures, syncope and headaches.  Hematological: Negative for adenopathy. Does not bruise/bleed easily.  Psychiatric/Behavioral: Negative for dysphoric mood. The patient is not nervous/anxious.        Objective:    BP 120/82 (BP Location: Left Arm, Patient Position: Sitting, Cuff Size: Large)   Pulse 97  Temp 97.9 F (36.6 C) (Oral)   Ht 6' (1.829 m)   Wt 270 lb 4 oz (122.6 kg)   SpO2 95%   BMI 36.65 kg/m   Wt Readings from Last 3 Encounters:  12/12/17 270 lb 4 oz (122.6 kg)  05/07/17 285 lb (129.3 kg)  10/27/16 282 lb (127.9 kg)    Physical Exam  Constitutional: He is oriented to person, place, and time. He appears well-developed and well-nourished. No distress.  HENT:  Head: Normocephalic and atraumatic.  Right Ear: Hearing, tympanic membrane,  external ear and ear canal normal.  Left Ear: Hearing, tympanic membrane, external ear and ear canal normal.  Nose: Nose normal.  Mouth/Throat: Uvula is midline, oropharynx is clear and moist and mucous membranes are normal. No oropharyngeal exudate, posterior oropharyngeal edema or posterior oropharyngeal erythema.  Eyes: Pupils are equal, round, and reactive to light. Conjunctivae and EOM are normal. No scleral icterus.  Neck: Normal range of motion. Neck supple. No thyromegaly present.  Cardiovascular: Normal rate, regular rhythm, normal heart sounds and intact distal pulses.  No murmur heard. Pulses:      Radial pulses are 2+ on the right side, and 2+ on the left side.  Pulmonary/Chest: Effort normal and breath sounds normal. No respiratory distress. He has no wheezes. He has no rales.  Abdominal: Soft. Bowel sounds are normal. He exhibits no distension and no mass. There is no tenderness. There is no rebound and no guarding.  Genitourinary: Rectum normal and prostate normal. Rectal exam shows no external hemorrhoid, no internal hemorrhoid, no fissure, no mass, no tenderness and anal tone normal. Prostate is not enlarged (20gm) and not tender.  Musculoskeletal: Normal range of motion. He exhibits no edema.  Lymphadenopathy:    He has no cervical adenopathy.  Neurological: He is alert and oriented to person, place, and time.  CN grossly intact, station and gait intact  Skin: Skin is warm and dry. No rash noted.  Psychiatric: He has a normal mood and affect. His behavior is normal. Judgment and thought content normal.  Nursing note and vitals reviewed.  Results for orders placed or performed in visit on 10/26/17  PSA  Result Value Ref Range   PSA 0.69 0.10 - 4.00 ng/mL  Hemoglobin A1c  Result Value Ref Range   Hgb A1c MFr Bld 5.9 4.6 - 6.5 %  Comprehensive metabolic panel  Result Value Ref Range   Sodium 139 135 - 145 mEq/L   Potassium 4.6 3.5 - 5.1 mEq/L   Chloride 104 96 - 112  mEq/L   CO2 27 19 - 32 mEq/L   Glucose, Bld 103 (H) 70 - 99 mg/dL   BUN 20 6 - 23 mg/dL   Creatinine, Ser 1.06 0.40 - 1.50 mg/dL   Total Bilirubin 0.9 0.2 - 1.2 mg/dL   Alkaline Phosphatase 44 39 - 117 U/L   AST 29 0 - 37 U/L   ALT 33 0 - 53 U/L   Total Protein 7.0 6.0 - 8.3 g/dL   Albumin 4.7 3.5 - 5.2 g/dL   Calcium 9.5 8.4 - 10.5 mg/dL   GFR 75.65 >60.00 mL/min  Lipid panel  Result Value Ref Range   Cholesterol 174 0 - 200 mg/dL   Triglycerides 90.0 0.0 - 149.0 mg/dL   HDL 50.20 >39.00 mg/dL   VLDL 18.0 0.0 - 40.0 mg/dL   LDL Cholesterol 106 (H) 0 - 99 mg/dL   Total CHOL/HDL Ratio 3    NonHDL 124.01  Assessment & Plan:   Problem List Items Addressed This Visit    Prediabetes    Improved with weight loss - motivated to continue this.       Obesity, Class II, BMI 35-39.9, no comorbidity    Encouraged healthy diet and lifestyle changes to affect sustainable weight loss. Congratulated on weight loss to date.       HLD (hyperlipidemia)    Chronic off meds. The 10-year ASCVD risk score Mikey Bussing DC Brooke Bonito., et al., 2013) is: 6.8%   Values used to calculate the score:     Age: 67 years     Sex: Male     Is Non-Hispanic African American: No     Diabetic: No     Tobacco smoker: No     Systolic Blood Pressure: 007 mmHg     Is BP treated: No     HDL Cholesterol: 50.2 mg/dL     Total Cholesterol: 174 mg/dL       Healthcare maintenance - Primary    Preventative protocols reviewed and updated unless pt declined. Discussed healthy diet and lifestyle.        Other Visit Diagnoses    Need for influenza vaccination       Relevant Orders   Flu Vaccine QUAD 36+ mos IM (Completed)       No orders of the defined types were placed in this encounter.  Orders Placed This Encounter  Procedures  . Flu Vaccine QUAD 36+ mos IM    Follow up plan: Return in about 1 year (around 12/13/2018) for annual exam, prior fasting for blood work.  Ria Bush, MD

## 2017-12-12 NOTE — Assessment & Plan Note (Signed)
Chronic off meds. The 10-year ASCVD risk score Mikey Bussing DC Brooke Bonito., et al., 2013) is: 6.8%   Values used to calculate the score:     Age: 62 years     Sex: Male     Is Non-Hispanic African American: No     Diabetic: No     Tobacco smoker: No     Systolic Blood Pressure: 810 mmHg     Is BP treated: No     HDL Cholesterol: 50.2 mg/dL     Total Cholesterol: 174 mg/dL

## 2017-12-12 NOTE — Assessment & Plan Note (Addendum)
Encouraged healthy diet and lifestyle changes to affect sustainable weight loss. Congratulated on weight loss to date.  

## 2017-12-12 NOTE — Patient Instructions (Addendum)
If interested, check with pharmacy about new 2 shot shingles series (shingrix).  You are doing well - keep up the good work with healthy diet and ongoing weight loss.  Return as needed or in 1 yr for next physical.  Health Maintenance, Male A healthy lifestyle and preventive care is important for your health and wellness. Ask your health care provider about what schedule of regular examinations is right for you. What should I know about weight and diet? Eat a Healthy Diet  Eat plenty of vegetables, fruits, whole grains, low-fat dairy products, and lean protein.  Do not eat a lot of foods high in solid fats, added sugars, or salt.  Maintain a Healthy Weight Regular exercise can help you achieve or maintain a healthy weight. You should:  Do at least 150 minutes of exercise each week. The exercise should increase your heart rate and make you sweat (moderate-intensity exercise).  Do strength-training exercises at least twice a week.  Watch Your Levels of Cholesterol and Blood Lipids  Have your blood tested for lipids and cholesterol every 5 years starting at 60 years of age. If you are at high risk for heart disease, you should start having your blood tested when you are 60 years old. You may need to have your cholesterol levels checked more often if: ? Your lipid or cholesterol levels are high. ? You are older than 60 years of age. ? You are at high risk for heart disease.  What should I know about cancer screening? Many types of cancers can be detected early and may often be prevented. Lung Cancer  You should be screened every year for lung cancer if: ? You are a current smoker who has smoked for at least 30 years. ? You are a former smoker who has quit within the past 15 years.  Talk to your health care provider about your screening options, when you should start screening, and how often you should be screened.  Colorectal Cancer  Routine colorectal cancer screening usually  begins at 60 years of age and should be repeated every 5-10 years until you are 60 years old. You may need to be screened more often if early forms of precancerous polyps or small growths are found. Your health care provider may recommend screening at an earlier age if you have risk factors for colon cancer.  Your health care provider may recommend using home test kits to check for hidden blood in the stool.  A small camera at the end of a tube can be used to examine your colon (sigmoidoscopy or colonoscopy). This checks for the earliest forms of colorectal cancer.  Prostate and Testicular Cancer  Depending on your age and overall health, your health care provider may do certain tests to screen for prostate and testicular cancer.  Talk to your health care provider about any symptoms or concerns you have about testicular or prostate cancer.  Skin Cancer  Check your skin from head to toe regularly.  Tell your health care provider about any new moles or changes in moles, especially if: ? There is a change in a mole's size, shape, or color. ? You have a mole that is larger than a pencil eraser.  Always use sunscreen. Apply sunscreen liberally and repeat throughout the day.  Protect yourself by wearing long sleeves, pants, a wide-brimmed hat, and sunglasses when outside.  What should I know about heart disease, diabetes, and high blood pressure?  If you are 34-40 years of age, have  your blood pressure checked every 3-5 years. If you are 71 years of age or older, have your blood pressure checked every year. You should have your blood pressure measured twice-once when you are at a hospital or clinic, and once when you are not at a hospital or clinic. Record the average of the two measurements. To check your blood pressure when you are not at a hospital or clinic, you can use: ? An automated blood pressure machine at a pharmacy. ? A home blood pressure monitor.  Talk to your health care  provider about your target blood pressure.  If you are between 18-50 years old, ask your health care provider if you should take aspirin to prevent heart disease.  Have regular diabetes screenings by checking your fasting blood sugar level. ? If you are at a normal weight and have a low risk for diabetes, have this test once every three years after the age of 3. ? If you are overweight and have a high risk for diabetes, consider being tested at a younger age or more often.  A one-time screening for abdominal aortic aneurysm (AAA) by ultrasound is recommended for men aged 43-75 years who are current or former smokers. What should I know about preventing infection? Hepatitis B If you have a higher risk for hepatitis B, you should be screened for this virus. Talk with your health care provider to find out if you are at risk for hepatitis B infection. Hepatitis C Blood testing is recommended for:  Everyone born from [birth year] through 1963.  Anyone with known risk factors for hepatitis C.  Sexually Transmitted Diseases (STDs)  You should be screened each year for STDs including gonorrhea and chlamydia if: ? You are sexually active and are younger than 60 years of age. ? You are older than 60 years of age and your health care provider tells you that you are at risk for this type of infection. ? Your sexual activity has changed since you were last screened and you are at an increased risk for chlamydia or gonorrhea. Ask your health care provider if you are at risk.  Talk with your health care provider about whether you are at high risk of being infected with HIV. Your health care provider may recommend a prescription medicine to help prevent HIV infection.  What else can I do?  Schedule regular health, dental, and eye exams.  Stay current with your vaccines (immunizations).  Do not use any tobacco products, such as cigarettes, chewing tobacco, and e-cigarettes. If you need help quitting, ask  your health care provider.  Limit alcohol intake to no more than 2 drinks per day. One drink equals 12 ounces of beer, 5 ounces of wine, or 1 ounces of hard liquor.  Do not use street drugs.  Do not share needles.  Ask your health care provider for help if you need support or information about quitting drugs.  Tell your health care provider if you often feel depressed.  Tell your health care provider if you have ever been abused or do not feel safe at home. This information is not intended to replace advice given to you by your health care provider. Make sure you discuss any questions you have with your health care provider. Document Released: 09/16/2007 Document Revised: 11/17/2015 Document Reviewed: 12/22/2014 Elsevier Interactive Patient Education  [employer].

## 2017-12-12 NOTE — Assessment & Plan Note (Signed)
Improved with weight loss - motivated to continue this.

## 2018-01-07 DIAGNOSIS — H524 Presbyopia: Secondary | ICD-10-CM | POA: Diagnosis not present

## 2018-12-16 ENCOUNTER — Other Ambulatory Visit: Payer: Self-pay | Admitting: Family Medicine

## 2018-12-16 ENCOUNTER — Other Ambulatory Visit: Payer: Self-pay

## 2018-12-16 ENCOUNTER — Other Ambulatory Visit (INDEPENDENT_AMBULATORY_CARE_PROVIDER_SITE_OTHER): Payer: Commercial Managed Care - PPO

## 2018-12-16 DIAGNOSIS — E7800 Pure hypercholesterolemia, unspecified: Secondary | ICD-10-CM

## 2018-12-16 DIAGNOSIS — Z125 Encounter for screening for malignant neoplasm of prostate: Secondary | ICD-10-CM

## 2018-12-16 DIAGNOSIS — R7303 Prediabetes: Secondary | ICD-10-CM

## 2018-12-16 LAB — COMPREHENSIVE METABOLIC PANEL
ALT: 31 U/L (ref 0–53)
AST: 21 U/L (ref 0–37)
Albumin: 4.2 g/dL (ref 3.5–5.2)
Alkaline Phosphatase: 39 U/L (ref 39–117)
BUN: 27 mg/dL — ABNORMAL HIGH (ref 6–23)
CO2: 28 mEq/L (ref 19–32)
Calcium: 8.8 mg/dL (ref 8.4–10.5)
Chloride: 103 mEq/L (ref 96–112)
Creatinine, Ser: 0.98 mg/dL (ref 0.40–1.50)
GFR: 77.63 mL/min (ref >60.00)
Glucose, Bld: 89 mg/dL (ref 70–99)
Potassium: 4.8 mEq/L (ref 3.5–5.1)
Sodium: 138 mEq/L (ref 135–145)
Total Bilirubin: 0.8 mg/dL (ref 0.2–1.2)
Total Protein: 6.3 g/dL (ref 6.0–8.3)

## 2018-12-16 LAB — LIPID PANEL
Cholesterol: 207 mg/dL — ABNORMAL HIGH (ref 0–200)
HDL: 56 mg/dL (ref >39.00)
LDL Cholesterol: 128 mg/dL — ABNORMAL HIGH (ref 0–99)
NonHDL: 150.51
Total CHOL/HDL Ratio: 4
Triglycerides: 114 mg/dL (ref 0.0–149.0)
VLDL: 22.8 mg/dL (ref 0.0–40.0)

## 2018-12-16 LAB — HEMOGLOBIN A1C: Hgb A1c MFr Bld: 6 % (ref 4.6–6.5)

## 2018-12-16 LAB — PSA: PSA: 0.55 ng/mL (ref 0.10–4.00)

## 2018-12-18 ENCOUNTER — Other Ambulatory Visit: Payer: Self-pay

## 2018-12-18 ENCOUNTER — Ambulatory Visit (INDEPENDENT_AMBULATORY_CARE_PROVIDER_SITE_OTHER): Payer: Commercial Managed Care - PPO | Admitting: Family Medicine

## 2018-12-18 ENCOUNTER — Encounter: Payer: Self-pay | Admitting: Family Medicine

## 2018-12-18 VITALS — BP 136/82 | HR 81 | Temp 97.9°F | Ht 73.0 in | Wt 313.4 lb

## 2018-12-18 DIAGNOSIS — R7303 Prediabetes: Secondary | ICD-10-CM | POA: Diagnosis not present

## 2018-12-18 DIAGNOSIS — E7800 Pure hypercholesterolemia, unspecified: Secondary | ICD-10-CM

## 2018-12-18 DIAGNOSIS — M542 Cervicalgia: Secondary | ICD-10-CM | POA: Insufficient documentation

## 2018-12-18 DIAGNOSIS — Z23 Encounter for immunization: Secondary | ICD-10-CM

## 2018-12-18 DIAGNOSIS — Z0001 Encounter for general adult medical examination with abnormal findings: Secondary | ICD-10-CM | POA: Diagnosis not present

## 2018-12-18 DIAGNOSIS — E669 Obesity, unspecified: Secondary | ICD-10-CM

## 2018-12-18 MED ORDER — METHOCARBAMOL 500 MG PO TABS: 500.0000 mg | ORAL_TABLET | Freq: Three times a day (TID) | ORAL | 0 refills | Status: DC | PRN

## 2018-12-18 NOTE — Progress Notes (Signed)
This visit was conducted in person.  BP 136/82 (BP Location: Left Arm, Patient Position: Sitting, Cuff Size: Large)    Pulse 81    Temp 97.9 F (36.6 C) (Temporal)    Ht 6\' 1"  (1.854 m)    Wt (!) 313 lb 7 oz (142.2 kg)    SpO2 97%    BMI 41.35 kg/m    CC: CPE Subjective:    Patient ID: Jordan Reynolds, male    DOB: 10-Jan-1956, 63 y.o.   MRN: [id number]  HPI: Jordan Reynolds is a 63 y.o. male presenting on 12/18/2018 for Annual Exam and Neck Pain (C/o neck pain.  Started 12/04/18 after missing a step coming down a ladder.  Pt did not fall but jolted his body. )   Stressful year. Lost his job this year. MIL moved in with dementia.  Marked weight gain noted this past year - 43 lbs.  Poor dietary choices, limited activity (see below).  "[place] off horse last year, still trying to find him".  2 wk h/o neck pain after fall coming down ladder. No fall, did jolt neck.  Treating with 6-8 advil daily. Has seen chiropractor x2 without benefit.  Constant pain at L occipital skull. This has limited activity.   Preventative: COLONOSCOPY Date: 03/2015 TA, rpt 5 yrs Ardis Hughs)  Prostate cancer screening - father with h/o prostate cancer - requests checkedyearly.  Lung cancer screening - not eligible - quit >15 yrs ago  Flu - yearly Td 2009, Tdap 10/2012  Shingrix - discussed, would like this  Seat belt use discussed  Sunscreen use discussed.No changing moles.  Ex smoker - quit 1998, 30+ PY hx.  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 Interim medical history since our last visit reviewed. Allergies and medications reviewed  and updated. Outpatient Medications Prior to Visit  Medication Sig Dispense Refill   Ascorbic Acid (VITAMIN C PO) Take by mouth. Takes 5,000 mg powder (1 teaspoon)     Methylsulfonylmethane (MSM) POWD Take by mouth. Takes 10,000 mg daily     Multiple Vitamin (MULTIVITAMIN) tablet Take 1 tablet by mouth daily.     No facility-administered medications prior to visit.      Per HPI unless specifically indicated in ROS section below Review of Systems  Constitutional: Negative for activity change, appetite change, chills, fatigue, fever and unexpected weight change.  HENT: Negative for hearing loss.   Eyes: Negative for visual disturbance.  Respiratory: Negative for cough, chest tightness, shortness of breath and wheezing.   Cardiovascular: Negative for chest pain, palpitations and leg swelling.  Gastrointestinal: Positive for abdominal pain (left sided). Negative for abdominal distention, blood in stool, constipation, diarrhea, nausea and vomiting.  Genitourinary: Negative for difficulty urinating and hematuria.  Musculoskeletal: Negative for arthralgias, myalgias and neck pain.  Skin: Negative for rash.  Neurological: Positive for headaches (from neck). Negative for dizziness, seizures and syncope.  Hematological: Negative for adenopathy. Does not bruise/bleed easily.  Psychiatric/Behavioral: Negative for dysphoric mood. The patient is not nervous/anxious.    Objective:    BP 136/82 (BP Location: Left Arm, Patient Position: Sitting,  Cuff Size: Large)    Pulse 81    Temp 97.9 F (36.6 C) (Temporal)    Ht 6\' 1"  (1.854 m)    Wt (!) 313 lb 7 oz (142.2 kg)    SpO2 97%    BMI 41.35 kg/m   Wt Readings from Last 3 Encounters:  12/18/18 (!) 313 lb 7 oz (142.2 kg)  12/12/17 270 lb 4 oz (122.6 kg)  05/07/17 285 lb (129.3 kg)    Physical Exam Vitals signs and nursing note reviewed.  Constitutional:      General: He is not in acute distress.    Appearance: Normal appearance. He is  well-developed. He is not ill-appearing.  HENT:     Head: Normocephalic and atraumatic.     Right Ear: Hearing, tympanic membrane, ear canal and external ear normal.     Left Ear: Hearing, tympanic membrane, ear canal and external ear normal.     Nose: Nose normal.     Mouth/Throat:     Mouth: Mucous membranes are moist.     Pharynx: Uvula midline. No oropharyngeal exudate or posterior oropharyngeal erythema.  Eyes:     General: No scleral icterus.    Extraocular Movements: Extraocular movements intact.     Conjunctiva/sclera: Conjunctivae normal.     Pupils: Pupils are equal, round, and reactive to light.  Neck:     Musculoskeletal: Normal range of motion and neck supple.     Vascular: No carotid bruit.     Comments:  No midline cervical neck pain No paracervical mm tightness No trap muscle tightness FROM at neck, reproducible pain with extension of neck Point tenderness to palpation L neck muscles below L occiput Cardiovascular:     Rate and Rhythm: Normal rate and regular rhythm.     Pulses: Normal pulses.          Radial pulses are 2+ on the right side and 2+ on the left side.     Heart sounds: Normal heart sounds. No murmur.  Pulmonary:     Effort: Pulmonary effort is normal. No respiratory distress.     Breath sounds: Normal breath sounds. No wheezing, rhonchi or rales.  Abdominal:     General: Abdomen is flat. Bowel sounds are normal. There is no distension.     Palpations: Abdomen is soft. There is no mass.     Tenderness: There is no abdominal tenderness. There is no guarding or rebound.     Hernia: No hernia is present.  Genitourinary:    Prostate: Normal. Not enlarged, not tender and no nodules present.     Rectum: Normal. No mass, tenderness, anal fissure, external hemorrhoid or internal hemorrhoid. Normal anal tone.  Musculoskeletal: Normal range of motion.     Right lower leg: No edema.     Left lower leg: No edema.  Lymphadenopathy:     Cervical: No cervical  adenopathy.  Skin:    General: Skin is warm and dry.     Findings: No rash.  Neurological:     General: No focal deficit present.     Mental Status: He is alert and oriented to person, place, and time.     Comments: CN grossly intact, station and gait intact  Psychiatric:        Mood and Affect: Mood normal.        Behavior: Behavior normal.        Thought Content: Thought content normal.        Judgment: Judgment normal.  Results for orders placed or performed in visit on 12/16/18  PSA  Result Value Ref Range   PSA 0.55 0.10 - 4.00 ng/mL  Hemoglobin A1c  Result Value Ref Range   Hgb A1c MFr Bld 6.0 4.6 - 6.5 %  Comprehensive metabolic panel  Result Value Ref Range   Sodium 138 135 - 145 mEq/L   Potassium 4.8 3.5 - 5.1 mEq/L   Chloride 103 96 - 112 mEq/L   CO2 28 19 - 32 mEq/L   Glucose, Bld 89 70 - 99 mg/dL   BUN 27 (H) 6 - 23 mg/dL   Creatinine, Ser 0.98 0.40 - 1.50 mg/dL   Total Bilirubin 0.8 0.2 - 1.2 mg/dL   Alkaline Phosphatase 39 39 - 117 U/L   AST 21 0 - 37 U/L   ALT 31 0 - 53 U/L   Total Protein 6.3 6.0 - 8.3 g/dL   Albumin 4.2 3.5 - 5.2 g/dL   Calcium 8.8 8.4 - 10.5 mg/dL   GFR 77.63 >60.00 mL/min  Lipid panel  Result Value Ref Range   Cholesterol 207 (H) 0 - 200 mg/dL   Triglycerides 114.0 0.0 - 149.0 mg/dL   HDL 56.00 >39.00 mg/dL   VLDL 22.8 0.0 - 40.0 mg/dL   LDL Cholesterol 128 (H) 0 - 99 mg/dL   Total CHOL/HDL Ratio 4    NonHDL 150.51    Depression screen [facility] 2/9 12/18/2018 12/12/2017  Decreased Interest 0 0  Down, Depressed, Hopeless 1 0  PHQ - 2 Score 1 0   Assessment & Plan:   Problem List Items Addressed This Visit    Prediabetes    Reviewed with patient, encouraged limiting added sugars in diet.       Obesity, Class II, BMI 35-39.9, no comorbidity    Encouraged renewed efforts towards healthy diet and regular exercise routine for sustainable weight loss.       Neck pain on left side    Story/exam most consistent with cervical  neck strain - Rx robaxin, rec heating pad and stretching exercises provided today. Reviewed advil use. Update if not improving with treatment.       HLD (hyperlipidemia)    Chronic off meds. Discussed benefits of statin with elevated ASCVD risk - he declines for now, prefers to work on TLC x 61mo.  The 10-year ASCVD risk score Mikey Bussing DC Brooke Bonito., et al., 2013) is: 9.9%   Values used to calculate the score:     Age: 77 years     Sex: Male     Is Non-Hispanic African American: No     Diabetic: No     Tobacco smoker: No     Systolic Blood Pressure: XX123456 mmHg     Is BP treated: No     HDL Cholesterol: 56 mg/dL     Total Cholesterol: 207 mg/dL       Encounter for well adult exam with abnormal findings - Primary    Preventative protocols reviewed and updated unless pt declined. Discussed healthy diet and lifestyle.        Other Visit Diagnoses    Need for influenza vaccination       Relevant Orders   Flu Vaccine QUAD 36+ mos IM (Completed)   Need for shingles vaccine       Relevant Orders   Varicella-zoster vaccine IM (Completed)       Meds ordered this encounter  Medications   methocarbamol (ROBAXIN) 500 MG tablet    Sig: Take 1-2  tablets (500-1,000 mg total) by mouth 3 (three) times daily as needed for muscle spasms (sedation precautions).    Dispense:  30 tablet    Refill:  0   Orders Placed This Encounter  Procedures   Flu Vaccine QUAD 36+ mos IM   Varicella-zoster vaccine IM    Patient instructions: Flu shot today First shingrix shot today - schedule nurse visit in 2-6 months to complete series.  For neck strain - treat with muscle relaxant sent to pharmacy as well as heating pad to neck. Do gentle stretching exercises as well provided today.  Renew efforts and healthy diet, regular exercise for goal weight loss.   Follow up plan: Return in about 1 year (around 12/18/2019) for annual exam, prior fasting for blood work.  Ria Bush, MD

## 2018-12-18 NOTE — Assessment & Plan Note (Signed)
Story/exam most consistent with cervical neck strain - Rx robaxin, rec heating pad and stretching exercises provided today. Reviewed advil use. Update if not improving with treatment.

## 2018-12-18 NOTE — Assessment & Plan Note (Signed)
Encouraged renewed efforts towards healthy diet and regular exercise routine for sustainable weight loss.

## 2018-12-18 NOTE — Assessment & Plan Note (Signed)
Preventative protocols reviewed and updated unless pt declined. Discussed healthy diet and lifestyle.  

## 2018-12-18 NOTE — Assessment & Plan Note (Addendum)
Chronic off meds. Discussed benefits of statin with elevated ASCVD risk - he declines for now, prefers to work on TLC x 39mo.  The 10-year ASCVD risk score Mikey Bussing DC Brooke Bonito., et al., 2013) is: 9.9%   Values used to calculate the score:     Age: 63 years     Sex: Male     Is Non-Hispanic African American: No     Diabetic: No     Tobacco smoker: No     Systolic Blood Pressure: [id number] mmHg     Is BP treated: No     HDL Cholesterol: 56 mg/dL     Total Cholesterol: 207 mg/dL

## 2018-12-18 NOTE — Patient Instructions (Addendum)
Flu shot today First shingrix shot today - schedule nurse visit in 2-6 months to complete series.  For neck strain - treat with muscle relaxant sent to pharmacy as well as heating pad to neck. Do gentle stretching exercises as well provided today.  Renew efforts and healthy diet, regular exercise for goal weight loss.   Health Maintenance, Male Adopting a healthy lifestyle and getting preventive care are important in promoting health and wellness. Ask your health care provider about:  The right schedule for you to have regular tests and exams.  Things you can do on your own to prevent diseases and keep yourself healthy. What should I know about diet, weight, and exercise? Eat a healthy diet   Eat a diet that includes plenty of vegetables, fruits, low-fat dairy products, and lean protein.  Do not eat a lot of foods that are high in solid fats, added sugars, or sodium. Maintain a healthy weight Body mass index (BMI) is a measurement that can be used to identify possible weight problems. It estimates body fat based on height and weight. Your health care provider can help determine your BMI and help you achieve or maintain a healthy weight. Get regular exercise Get regular exercise. This is one of the most important things you can do for your health. Most adults should:  Exercise for at least 150 minutes each week. The exercise should increase your heart rate and make you sweat (moderate-intensity exercise).  Do strengthening exercises at least twice a week. This is in addition to the moderate-intensity exercise.  Spend less time sitting. Even light physical activity can be beneficial. Watch cholesterol and blood lipids Have your blood tested for lipids and cholesterol at 61 years of age, then have this test every 5 years. You may need to have your cholesterol levels checked more often if:  Your lipid or cholesterol levels are high.  You are older than 61 years of age.  You are at  high risk for heart disease. What should I know about cancer screening? Many types of cancers can be detected early and may often be prevented. Depending on your health history and family history, you may need to have cancer screening at various ages. This may include screening for:  Colorectal cancer.  Prostate cancer.  Skin cancer.  Lung cancer. What should I know about heart disease, diabetes, and high blood pressure? Blood pressure and heart disease  High blood pressure causes heart disease and increases the risk of stroke. This is more likely to develop in people who have high blood pressure readings, are of African descent, or are overweight.  Talk with your health care provider about your target blood pressure readings.  Have your blood pressure checked: ? Every 3-5 years if you are 37-29 years of age. ? Every year if you are 67 years old or older.  If you are between the ages of 85 and 9 and are a current or former smoker, ask your health care provider if you should have a one-time screening for abdominal aortic aneurysm (AAA). Diabetes Have regular diabetes screenings. This checks your fasting blood sugar level. Have the screening done:  Once every three years after age 66 if you are at a normal weight and have a low risk for diabetes.  More often and at a younger age if you are overweight or have a high risk for diabetes. What should I know about preventing infection? Hepatitis B If you have a higher risk for hepatitis B, you  should be screened for this virus. Talk with your health care provider to find out if you are at risk for hepatitis B infection. Hepatitis C Blood testing is recommended for:  Everyone born from [birth year] through 1963.  Anyone with known risk factors for hepatitis C. Sexually transmitted infections (STIs)  You should be screened each year for STIs, including gonorrhea and chlamydia, if: ? You are sexually active and are younger than 61 years of  age. ? You are older than 61 years of age and your health care provider tells you that you are at risk for this type of infection. ? Your sexual activity has changed since you were last screened, and you are at increased risk for chlamydia or gonorrhea. Ask your health care provider if you are at risk.  Ask your health care provider about whether you are at high risk for HIV. Your health care provider may recommend a prescription medicine to help prevent HIV infection. If you choose to take medicine to prevent HIV, you should first get tested for HIV. You should then be tested every 3 months for as long as you are taking the medicine. Follow these instructions at home: Lifestyle  Do not use any products that contain nicotine or tobacco, such as cigarettes, e-cigarettes, and chewing tobacco. If you need help quitting, ask your health care provider.  Do not use street drugs.  Do not share needles.  Ask your health care provider for help if you need support or information about quitting drugs. Alcohol use  Do not drink alcohol if your health care provider tells you not to drink.  If you drink alcohol: ? Limit how much you have to 0-2 drinks a day. ? Be aware of how much alcohol is in your drink. In the [place] one drink equals one 12 oz bottle of beer (355 mL), one 5 oz glass of wine (148 mL), or one 1 oz glass of hard liquor (44 mL). General instructions  Schedule regular health, dental, and eye exams.  Stay current with your vaccines.  Tell your health care provider if: ? You often feel depressed. ? You have ever been abused or do not feel safe at home. Summary  Adopting a healthy lifestyle and getting preventive care are important in promoting health and wellness.  Follow your health care provider's instructions about healthy diet, exercising, and getting tested or screened for diseases.  Follow your health care provider's instructions on monitoring your cholesterol and blood  pressure. This information is not intended to replace advice given to you by your health care provider. Make sure you discuss any questions you have with your health care provider. Document Released: 09/16/2007 Document Revised: 03/13/2018 Document Reviewed: 03/13/2018 Elsevier Patient Education  2020 [employer].

## 2018-12-18 NOTE — Assessment & Plan Note (Signed)
Reviewed with patient, encouraged limiting added sugars in diet.  

## 2019-01-07 ENCOUNTER — Telehealth: Payer: Self-pay | Admitting: Family Medicine

## 2019-01-07 NOTE — Telephone Encounter (Signed)
I believe we focused more on his neck pain at physical.  plz triage symptoms - where in abdomen is pain, any fever, any nausea/vomiting, reflux, bowel changes, any radiation of pain?  Will recommend in office visit for more detailed review of abd pain as may also need more detailed labwork that was not checked at physical.

## 2019-01-07 NOTE — Telephone Encounter (Signed)
Patient was seen for his cpx on 12/19/18.  Patient discussed having pain in stomach.  Patient said it's gone from pain to a stabbing pain.  Patient would like to have it checked.  Patient wants an ultrasound or a referral to find out what's wrong.

## 2019-01-08 NOTE — Telephone Encounter (Signed)
See my previous note. Recommend office visit for further evaluation.

## 2019-01-08 NOTE — Telephone Encounter (Addendum)
Left message for pt to call back.  Need to triage pt's abd pain.

## 2019-01-08 NOTE — Telephone Encounter (Signed)
Spoke asking for more info about abd pain.  States he mentioned it to Dr. Darnell Level during [place] and Dr. Darnell Level felt the area and suggested it may be a cyst.  Pt denies any fever, N/V or bowel changes.  However, he describes abd pain as a stabbing pain now and mainly occurs when going to a standing position.  Pt is requesting a referral.  [Gives permission to leave detailed vm.]

## 2019-01-09 NOTE — Telephone Encounter (Signed)
Spoke with pt informing him Dr. Darnell Level still wants to see him for the abd pain.  Pt verbalizes understanding and is scheduled for 01/10/19 at 7:15.

## 2019-01-10 ENCOUNTER — Other Ambulatory Visit: Payer: Self-pay

## 2019-01-10 ENCOUNTER — Ambulatory Visit: Payer: Commercial Managed Care - PPO | Admitting: Family Medicine

## 2019-01-10 ENCOUNTER — Encounter: Payer: Self-pay | Admitting: Family Medicine

## 2019-01-10 VITALS — BP 136/84 | HR 73 | Temp 97.9°F | Wt 313.0 lb

## 2019-01-10 DIAGNOSIS — R1012 Left upper quadrant pain: Secondary | ICD-10-CM | POA: Diagnosis not present

## 2019-01-10 NOTE — Patient Instructions (Addendum)
Exam looking ok today - doubt abdominal cause but rather superficial skin issue - possible pinched nerve causing pain.  Treat with anti inflammatoy voltaren (diclofenac) gel over the counter 2-3 times a day to affected area.  Update me with effect.  We will order abdominal ultrasound to check spleen.  Use heating pad onto area.

## 2019-01-10 NOTE — Assessment & Plan Note (Signed)
No significant abdominal discomfort on exam today - actually has more superficial reproducible tenderness with even light touch/rub across skin - hyperalgesia of skin in this area. Will check abd [place] to evaluate spleen given location of pain. Anticipate more hypersensitive skin for some reason. Recommend heating pad, OTC voltaren gel. Update with effect. Does not have GERD symptoms or other significant GI symptoms.

## 2019-01-10 NOTE — Progress Notes (Signed)
This visit was conducted in person.  BP 136/84   Pulse 73   Temp 97.9 F (36.6 C) (Temporal)   Wt (!) 313 lb (142 kg)   SpO2 98%   BMI 41.30 kg/m    CC: abd pain Subjective:    Patient ID: Jordan Reynolds, male    DOB: 11/09/1955, 63 y.o.   MRN: [id number]  HPI: Jordan Reynolds is a 63 y.o. male presenting on 01/10/2019 for Abdominal Pain (more painful, stabbing pain now... denies N/V/D...denies blood in stools)   Several year history of LUQ abd discomfort "gas pain" worse with overeating. Over last 2 weeks, noticing worsening and constant discomfort when worse feels like knife in side. Possibly worse with onions.   No n/v/d/c, f/c. No GERD symptoms or indigestion. No dysphagia, early satiety. No burning pain, paresthesias or numbness of skin.  Treating with OTC prevacid 2 wk course without significant benefit.      Relevant past medical, surgical, family and social history reviewed and updated as indicated. Interim medical history since our last visit reviewed. Allergies and medications reviewed and updated. Outpatient Medications Prior to Visit  Medication Sig Dispense Refill  . Ascorbic Acid (VITAMIN C PO) Take by mouth. Takes 5,000 mg powder (1 teaspoon)    . methocarbamol (ROBAXIN) 500 MG tablet Take 1-2 tablets (500-1,000 mg total) by mouth 3 (three) times daily as needed for muscle spasms (sedation precautions). 30 tablet 0  . Methylsulfonylmethane (MSM) POWD Take by mouth. Takes 10,000 mg daily    . Multiple Vitamin (MULTIVITAMIN) tablet Take 1 tablet by mouth daily.     No facility-administered medications prior to visit.      Per HPI unless specifically indicated in ROS section below Review of Systems Objective:    BP 136/84   Pulse 73   Temp 97.9 F (36.6 C) (Temporal)   Wt (!) 313 lb (142 kg)   SpO2 98%   BMI 41.30 kg/m   Wt Readings from Last 3 Encounters:  01/10/19 (!) 313 lb (142 kg)  12/18/18 (!) 313 lb 7 oz (142.2 kg)  12/12/17 270 lb 4 oz (122.6  kg)    Physical Exam Vitals signs and nursing note reviewed.  Constitutional:      General: He is not in acute distress.    Appearance: Normal appearance. He is not ill-appearing.  HENT:     Mouth/Throat:     Mouth: Mucous membranes are moist.     Pharynx: Oropharynx is clear. No posterior oropharyngeal erythema.  Cardiovascular:     Rate and Rhythm: Normal rate and regular rhythm.     Pulses: Normal pulses.     Heart sounds: Normal heart sounds. No murmur.  Pulmonary:     Effort: Pulmonary effort is normal. No respiratory distress.     Breath sounds: Normal breath sounds. No wheezing, rhonchi or rales.  Abdominal:     General: Bowel sounds are normal. There is no distension.     Palpations: Abdomen is soft. There is no mass.     Tenderness: There is abdominal tenderness in the left upper quadrant. There is no right CVA tenderness, left CVA tenderness, guarding or rebound.     Hernia: No hernia is present.       Comments: Reproducible tenderness even to light touch of skin along L upper abdomen below ribcage.   Skin:    General: Skin is warm.     Findings: No erythema or rash.  Neurological:  Mental Status: He is alert.  Psychiatric:        Mood and Affect: Mood normal.        Behavior: Behavior normal.       Results for orders placed or performed in visit on 12/16/18  PSA  Result Value Ref Range   PSA 0.55 0.10 - 4.00 ng/mL  Hemoglobin A1c  Result Value Ref Range   Hgb A1c MFr Bld 6.0 4.6 - 6.5 %  Comprehensive metabolic panel  Result Value Ref Range   Sodium 138 135 - 145 mEq/L   Potassium 4.8 3.5 - 5.1 mEq/L   Chloride 103 96 - 112 mEq/L   CO2 28 19 - 32 mEq/L   Glucose, Bld 89 70 - 99 mg/dL   BUN 27 (H) 6 - 23 mg/dL   Creatinine, Ser 0.98 0.40 - 1.50 mg/dL   Total Bilirubin 0.8 0.2 - 1.2 mg/dL   Alkaline Phosphatase 39 39 - 117 U/L   AST 21 0 - 37 U/L   ALT 31 0 - 53 U/L   Total Protein 6.3 6.0 - 8.3 g/dL   Albumin 4.2 3.5 - 5.2 g/dL   Calcium 8.8 8.4  - 10.5 mg/dL   GFR 77.63 >60.00 mL/min  Lipid panel  Result Value Ref Range   Cholesterol 207 (H) 0 - 200 mg/dL   Triglycerides 114.0 0.0 - 149.0 mg/dL   HDL 56.00 >39.00 mg/dL   VLDL 22.8 0.0 - 40.0 mg/dL   LDL Cholesterol 128 (H) 0 - 99 mg/dL   Total CHOL/HDL Ratio 4    NonHDL 150.51    Assessment & Plan:   Problem List Items Addressed This Visit    LUQ abdominal pain - Primary    No significant abdominal discomfort on exam today - actually has more superficial reproducible tenderness with even light touch/rub across skin - hyperalgesia of skin in this area. Will check abd [place] to evaluate spleen given location of pain. Anticipate more hypersensitive skin for some reason. Recommend heating pad, OTC voltaren gel. Update with effect. Does not have GERD symptoms or other significant GI symptoms.       Relevant Orders   US Abdomen Complete       No orders of the defined types were placed in this encounter.  Orders Placed This Encounter  Procedures  . US Abdomen Complete    Standing Status:   Future    Standing Expiration Date:   03/11/2020    Order Specific Question:   Reason for Exam (SYMPTOM  OR DIAGNOSIS REQUIRED)    Answer:   LUQ abd discomfort    Order Specific Question:   Preferred imaging location?    Answer:   [facility]    Follow up plan: Return if symptoms worsen or fail to improve.  Jordan Bush, MD

## 2019-01-20 ENCOUNTER — Ambulatory Visit
Admission: RE | Admit: 2019-01-20 | Discharge: 2019-01-20 | Disposition: A | Discharge status: Home / Self Care | Payer: Commercial Managed Care - PPO | Source: Ambulatory Visit | Attending: Family Medicine | Admitting: Family Medicine

## 2019-01-20 DIAGNOSIS — R1012 Left upper quadrant pain: Secondary | ICD-10-CM

## 2019-01-21 ENCOUNTER — Encounter: Payer: Self-pay | Admitting: Family Medicine

## 2019-01-21 DIAGNOSIS — K760 Fatty (change of) liver, not elsewhere classified: Secondary | ICD-10-CM | POA: Insufficient documentation

## 2019-01-23 ENCOUNTER — Telehealth: Payer: Self-pay | Admitting: Family Medicine

## 2019-01-23 NOTE — Telephone Encounter (Signed)
Returned pt's call.  See Imaging Result Note, 01/20/19.

## 2019-01-23 NOTE — Telephone Encounter (Signed)
Patient is returning your call.   C/b # [phone]

## 2019-03-14 ENCOUNTER — Telehealth: Payer: Self-pay

## 2019-03-14 NOTE — Telephone Encounter (Signed)
Left message for patient to cal back to be pre screened for his nurse visit appointment on 03/18/2019

## 2019-03-18 ENCOUNTER — Ambulatory Visit (INDEPENDENT_AMBULATORY_CARE_PROVIDER_SITE_OTHER): Payer: Commercial Managed Care - PPO | Admitting: *Deleted

## 2019-03-18 ENCOUNTER — Other Ambulatory Visit: Payer: Self-pay

## 2019-03-18 DIAGNOSIS — Z23 Encounter for immunization: Secondary | ICD-10-CM | POA: Diagnosis not present

## 2019-03-18 NOTE — Progress Notes (Signed)
Per orders of Dr. Danise Mina, injection of Shingrix (#2) given by Virl Cagey. Patient tolerated injection well.

## 2019-06-19 ENCOUNTER — Ambulatory Visit: Payer: Commercial Managed Care - PPO | Attending: Internal Medicine

## 2019-06-19 ENCOUNTER — Ambulatory Visit: Payer: Self-pay

## 2019-06-19 DIAGNOSIS — Z23 Encounter for immunization: Secondary | ICD-10-CM

## 2019-06-19 NOTE — Progress Notes (Signed)
   Covid-19 Vaccination Clinic  Name:  Jordan Reynolds    MRN: [id number] DOB: [birth year]  06/19/2019  Jordan Reynolds was observed post Covid-19 immunization for 15 minutes without incident. He was provided with Vaccine Information Sheet and instruction to access the V-Safe system.   Jordan Reynolds was instructed to call 911 with any severe reactions post vaccine: Marland Kitchen Difficulty breathing  . Swelling of face and throat  . A fast heartbeat  . A bad rash all over body  . Dizziness and weakness   Immunizations Administered    Name Date Dose VIS Date Route   Pfizer COVID-19 Vaccine 06/19/2019 10:03 AM 0.3 mL 03/14/2019 Intramuscular   Manufacturer: [place]   Lot: [id number]   [place]: [id number]

## 2019-07-15 ENCOUNTER — Ambulatory Visit: Payer: Self-pay

## 2019-07-16 ENCOUNTER — Ambulatory Visit: Payer: Self-pay | Attending: Internal Medicine

## 2019-07-16 DIAGNOSIS — Z23 Encounter for immunization: Secondary | ICD-10-CM

## 2019-07-16 NOTE — Progress Notes (Signed)
   Covid-19 Vaccination Clinic  Name:  Jordan Reynolds    MRN: [id number] DOB: 02-Oct-1955  07/16/2019  [name] was observed post Covid-19 immunization for 15 minutes without incident. He was provided with Vaccine Information Sheet and instruction to access the V-Safe system.   [name] was instructed to call 911 with any severe reactions post vaccine: Marland Kitchen Difficulty breathing  . Swelling of face and throat  . A fast heartbeat  . A bad rash all over body  . Dizziness and weakness   Immunizations Administered    Name Date Dose VIS Date Route   Pfizer COVID-19 Vaccine 07/16/2019  1:52 PM 0.3 mL 03/14/2019 Intramuscular   Manufacturer: [employer]   Lot: [id number]   [place]: [id number]

## 2019-10-03 ENCOUNTER — Other Ambulatory Visit: Payer: Self-pay | Admitting: Physical Medicine and Rehabilitation

## 2019-10-03 DIAGNOSIS — M545 Low back pain, unspecified: Secondary | ICD-10-CM

## 2019-10-25 ENCOUNTER — Ambulatory Visit
Admission: RE | Admit: 2019-10-25 | Discharge: 2019-10-25 | Disposition: A | Discharge status: Home / Self Care | Payer: Commercial Managed Care - PPO | Source: Ambulatory Visit | Attending: Physical Medicine and Rehabilitation | Admitting: Physical Medicine and Rehabilitation

## 2019-10-25 DIAGNOSIS — M545 Low back pain, unspecified: Secondary | ICD-10-CM

## 2019-11-02 ENCOUNTER — Encounter: Payer: Self-pay | Admitting: Family Medicine

## 2019-11-02 DIAGNOSIS — M5416 Radiculopathy, lumbar region: Secondary | ICD-10-CM | POA: Insufficient documentation

## 2019-11-06 ENCOUNTER — Other Ambulatory Visit: Payer: Commercial Managed Care - PPO

## 2020-01-29 ENCOUNTER — Other Ambulatory Visit: Payer: Self-pay

## 2020-01-29 ENCOUNTER — Encounter: Payer: Self-pay | Admitting: Internal Medicine

## 2020-01-29 ENCOUNTER — Ambulatory Visit: Payer: Commercial Managed Care - PPO | Admitting: Internal Medicine

## 2020-01-29 VITALS — BP 120/88 | HR 86 | Ht 74.0 in | Wt 331.4 lb

## 2020-01-29 DIAGNOSIS — R0602 Shortness of breath: Secondary | ICD-10-CM

## 2020-01-29 DIAGNOSIS — E7800 Pure hypercholesterolemia, unspecified: Secondary | ICD-10-CM | POA: Diagnosis not present

## 2020-01-29 NOTE — Progress Notes (Signed)
New Outpatient Visit Date: 01/29/2020  Primary Care provider: Ria Bush, [place],  [place] [address]  Chief Complaint: Heart checkup  HPI:  Mr. Jordan Reynolds is a 64 y.o. male who is being seen today as a self referral for evaluation of cardiovascular risk. He has a history of prediabetes, morbid obesity.  His brother was recently hospitalized at [facility] and required 3 stents.  His wife is also undergoing risk assessment for coronary artery disease.  Mr. Jordan Reynolds therefore wishes to have a thorough assessment to see if he is at risk for cardiovascular disease.  Overall, Mr. Jordan Reynolds reports that he has been feeling fairly well.  He retired in March and was less mobile than usual due to a back injury.  He is just now starting to increase his activity again he has put on about 40 pounds since he retired.  Mr. Jordan Reynolds notes exertional dyspnea when walking uphill as well as some shortness of breath when he bends over to put on his socks.  He attributes this to his weight.  He denies chest pain, palpitations, and edema at this time, though he noted transient right calf swelling about a month ago.  He has experienced occasional lightheadedness with coughing spells.  He denies prior cardiac disease and testing.  --------------------------------------------------------------------------------------------------  Cardiovascular History & Procedures: Cardiovascular Problems:  Shortness of breath  Risk Factors:  Prediabetes, male gender, morbid obesity, and prior tobacco use  Cath/PCI:  None  CV Surgery:  None  EP Procedures and Devices:  None  Non-Invasive Evaluation(s):  None  Recent CV Pertinent Labs: Lab Results  Component Value Date   CHOL 207 (H) 12/16/2018   HDL 56.00 12/16/2018   LDLCALC 128 (H) 12/16/2018   TRIG 114.0 12/16/2018   CHOLHDL 4 12/16/2018   K 4.8 12/16/2018   BUN 27 (H) 12/16/2018   CREATININE 0.98 12/16/2018     --------------------------------------------------------------------------------------------------  Past Medical History:  Diagnosis Date  . Allergic rhinitis   . Ex-smoker 1998  . History of chicken pox   . Inguinal hernia    right  . Primary syphilis 10/13/2014   treated    Past Surgical History:  Procedure Laterality Date  . COLONOSCOPY  03/2015   TA, rpt 5 yrs Ardis Hughs)  . DENTAL SURGERY  2014   implants  . INGUINAL HERNIA REPAIR Right 05/14/2017   Procedure: OPEN RIGHT INGUINAL HERNIA REPAIR WITH MESH;  Surgeon: Judeth Horn, MD;  Location: [facility];  Service: General;  Laterality: Right;  . INSERTION OF MESH Right 05/14/2017   Procedure: INSERTION OF MESH;  Surgeon: Judeth Horn, MD;  Location: [facility];  Service: General;  Laterality: Right;  . TONSILLECTOMY  1965    Current Meds  Medication Sig  . Amoxicillin-Pot Clavulanate (AUGMENTIN PO) Take by mouth every 6 (six) hours as needed.  . Ascorbic Acid (VITAMIN C PO) Take by mouth. Takes 5,000 mg powder (1 teaspoon)  . Methylsulfonylmethane (MSM) POWD Take by mouth. Takes 10,000 mg daily  . Multiple Vitamin (MULTIVITAMIN) tablet Take 1 tablet by mouth daily.  . [DISCONTINUED] methocarbamol (ROBAXIN) 500 MG tablet Take 1-2 tablets (500-1,000 mg total) by mouth 3 (three) times daily as needed for muscle spasms (sedation precautions).    Allergies: Patient has no known allergies.  Social History   Tobacco Use  . Smoking status: Former Smoker    Packs/day: 2.00    Years: 25.00    Pack years: 50.00  Types: Cigarettes    Quit date: 05/07/1996    Years since quitting: 23.7  . Smokeless tobacco: Never Used  . Tobacco comment: quit October 1998  Vaping Use  . Vaping Use: Never used  Substance Use Topics  . Alcohol use: Yes    Alcohol/week: 6.0 standard drinks    Types: 6 Cans of beer per week  . Drug use: No    Family History  Problem Relation Age of Onset  . Other Mother         benign brain tumor  . Diabetes Mother        early  . Stroke Mother   . Hyperlipidemia Mother   . CAD Father 48       unsure, deceased  . Cancer Father 80       prostate  . Hypertension Father   . Other Sister 19       some brainstem compression issue  . CAD Brother        3 stents placed   . Hyperlipidemia Brother   . Colon cancer Neg Hx     Review of Systems: Mr. Jordan Reynolds is currently on Augmentin after having recent dental work done.  Otherwise, a 12-system review of systems was performed and was negative except as noted in the HPI.  --------------------------------------------------------------------------------------------------  Physical Exam: BP 120/88 (BP Location: Right Arm, Patient Position: Sitting, Cuff Size: Large)   Pulse 86   Ht 6\' 2"  (1.88 m)   Wt (!) 331 lb 6 oz (150.3 kg)   SpO2 99%   BMI 42.55 kg/m   General: NAD. HEENT: No conjunctival pallor or scleral icterus. Facemask in place. Neck: Supple without lymphadenopathy, thyromegaly, JVD, or HJR. No carotid bruit. Lungs: Normal work of breathing. Clear to auscultation bilaterally without wheezes or crackles. Heart: Regular rate and rhythm without murmurs, rubs, or gallops. Non-displaced PMI. Abd: Bowel sounds present. Soft, NT/ND without hepatosplenomegaly Ext: No lower extremity edema. Radial, PT, and DP pulses are 2+ bilaterally Skin: Warm and dry without rash. Neuro: CNIII-XII intact. Strength and fine-touch sensation intact in upper and lower extremities bilaterally. Psych: Normal mood and affect.  EKG: Normal sinus rhythm without abnormality.  Lab Results  Component Value Date   WBC 6.2 05/08/2017   HGB 14.6 05/08/2017   HCT 44.0 05/08/2017   MCV 87.6 05/08/2017   PLT 184 05/08/2017    Lab Results  Component Value Date   NA 138 12/16/2018   K 4.8 12/16/2018   CL 103 12/16/2018   CO2 28 12/16/2018   BUN 27 (H) 12/16/2018   CREATININE 0.98 12/16/2018   GLUCOSE 89 12/16/2018   ALT  31 12/16/2018    Lab Results  Component Value Date   CHOL 207 (H) 12/16/2018   HDL 56.00 12/16/2018   LDLCALC 128 (H) 12/16/2018   TRIG 114.0 12/16/2018   CHOLHDL 4 12/16/2018     --------------------------------------------------------------------------------------------------  ASSESSMENT AND PLAN: Shortness of breath and ASCVD risk assessment: Other than some exertional dyspnea and bendopnea, Mr. Jordan Reynolds is asymptomatic.  He attributes these symptoms to weight gain and inactivity over the last 6 months, which is certainly plausible.  His physical examination and EKG today are unremarkable.  Cardiac risk factors include prediabetes, male gender, morbid obesity, age greater than 40, and prior tobacco use.  Based on his demographics, blood pressure, and lipid profile, his 10-year ASCVD risk is 8.7% (intermediate risk category).  We discussed strategies to help improve his risk including weight loss through diet and  exercise.  Most recent lipid panel is notable for an LDL of 128.  We discussed addition of a statin to help lower his risk as well but would like to work on lifestyle modifications first.  Given minimal symptoms, I do not think that formal ischemia testing is needed at this time.  We discussed the risks and benefits of coronary artery calcium scoring to further assess his ASCVD risk.  Mr. Jordan Reynolds would like to defer this in favor of lifestyle modifications first.  We will assess his progress in 6 months and readdress testing at that time.  Hyperlipidemia: Mildly elevated LDL noted on recent check.  I encouraged Mr. Jordan Reynolds to work on this through dietary changes and exercise.  Morbid obesity: BMI greater than 40.  I encouraged Mr. Jordan Reynolds to work on weight loss through diet and exercise.  Follow-up: Return to clinic in 6 months.  Nelva Bush, MD 01/29/2020 1:36 PM

## 2020-01-29 NOTE — Patient Instructions (Signed)
Medication Instructions:  Your physician recommends that you continue on your current medications as directed. Please refer to the Current Medication list given to you today.  *If you need a refill on your cardiac medications before your next appointment, please call your pharmacy*   Follow-Up: At [facility], you and your health needs are our priority.  As part of our continuing mission to provide you with exceptional heart care, we have created designated Provider Care Teams.  These Care Teams include your primary Cardiologist (physician) and Advanced Practice Providers (APPs -  Physician Assistants and Nurse Practitioners) who all work together to provide you with the care you need, when you need it.  We recommend signing up for the patient portal called "MyChart".  Sign up information is provided on this After Visit Summary.  MyChart is used to connect with patients for Virtual Visits (Telemedicine).  Patients are able to view lab/test results, encounter notes, upcoming appointments, etc.  Non-urgent messages can be sent to your provider as well.   To learn more about what you can do with MyChart, go to [email or web address].    Your next appointment:   6 month(s)  The format for your next appointment:   In Person  Provider:   You may see DR Harrell Gave END or one of the following Advanced Practice Providers on your designated Care Team:    Murray Hodgkins, NP  Christell Faith, PA-C  Marrianne Mood, PA-C  Cadence [place], [place]

## 2020-01-30 ENCOUNTER — Encounter: Payer: Self-pay | Admitting: Internal Medicine

## 2020-01-30 DIAGNOSIS — R0602 Shortness of breath: Secondary | ICD-10-CM | POA: Insufficient documentation

## 2020-02-12 IMAGING — US US ABDOMEN COMPLETE
1 series · 13 of 25 positions shown · non-contrast
Comparison: None.

CLINICAL DATA: Abdominal discomfort, primarily left-sided

EXAM:
ABDOMEN ULTRASOUND COMPLETE

[Series 1: us abdomen complete · 0.30mm/px · 13 of 88 slices shown]
[im 1/88]
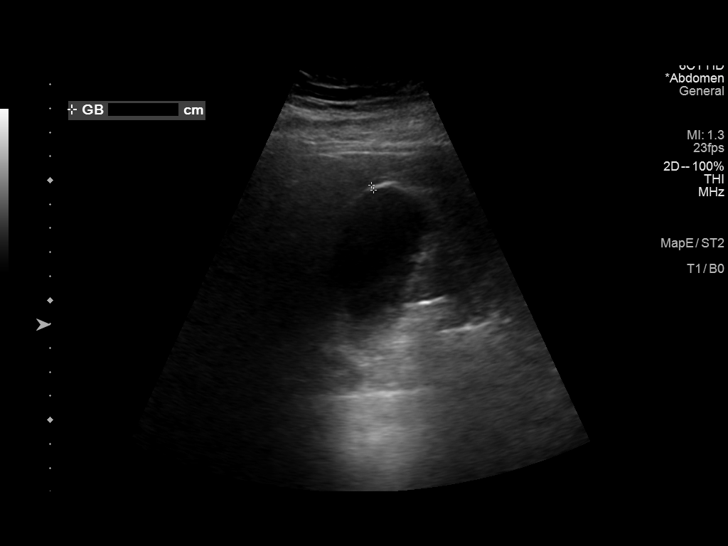
[im 8/88]
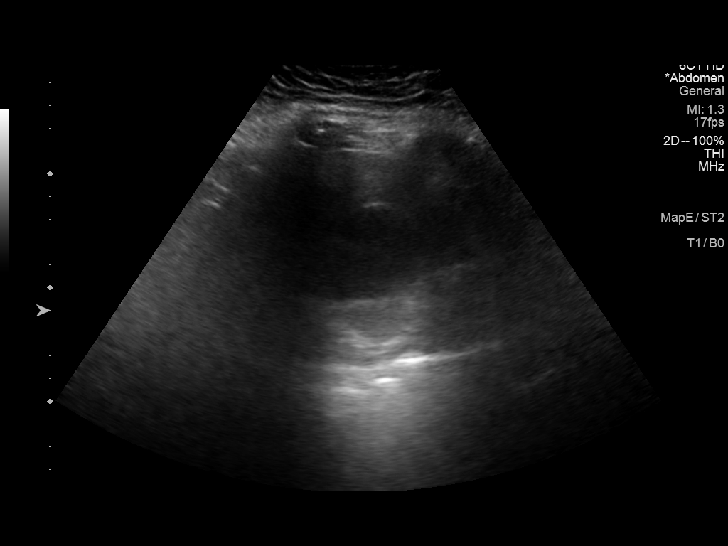
[im 15/88]
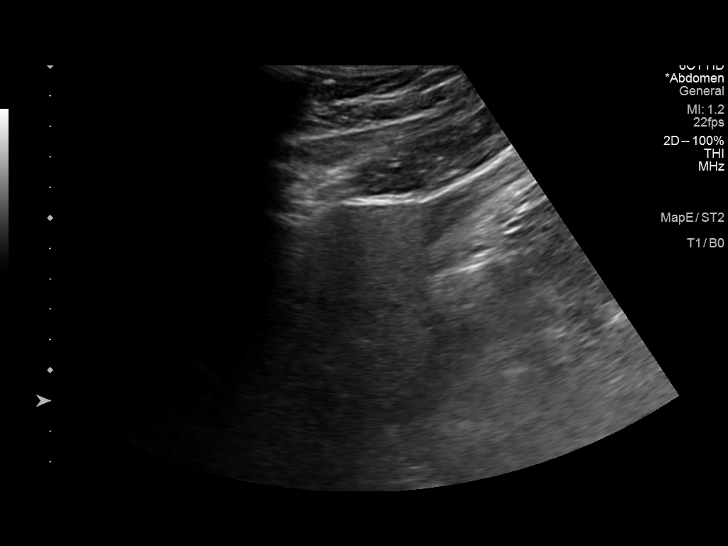
[im 22/88]
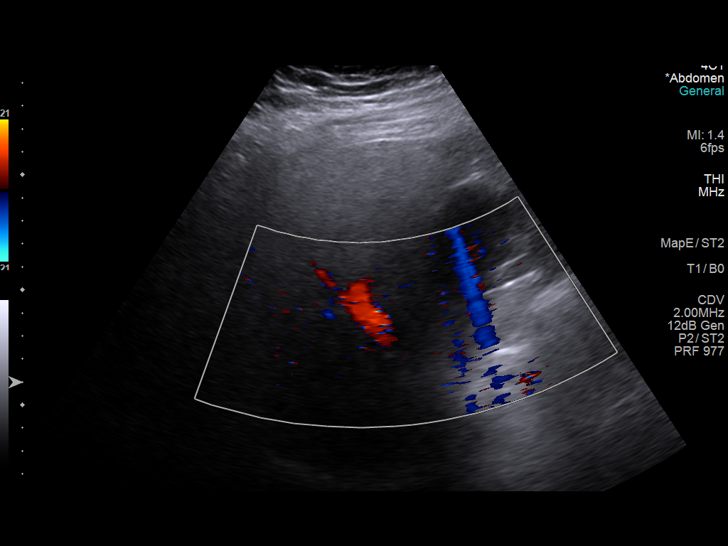
[im 30/88]
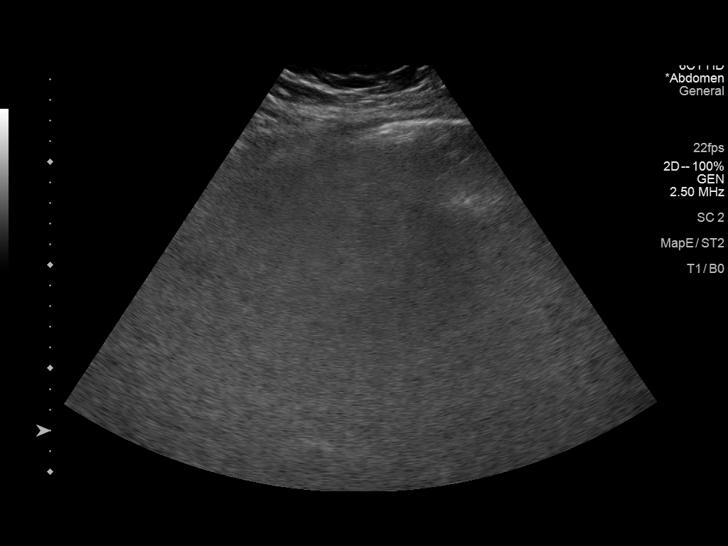
[im 37/88]
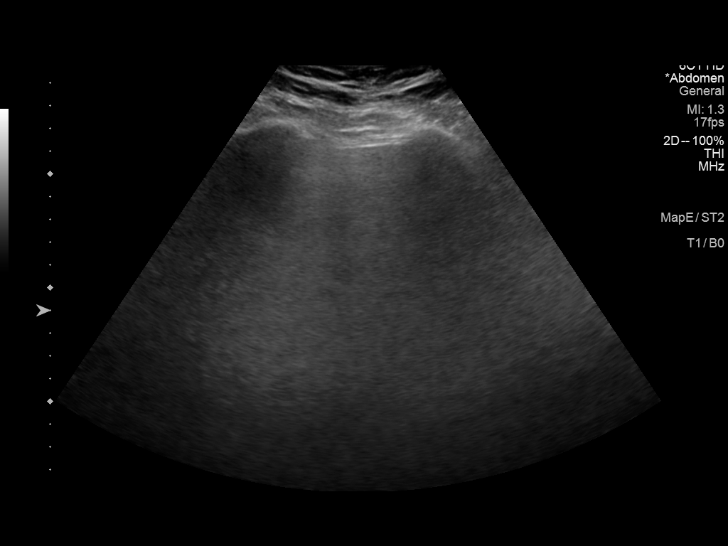
[im 44/88]
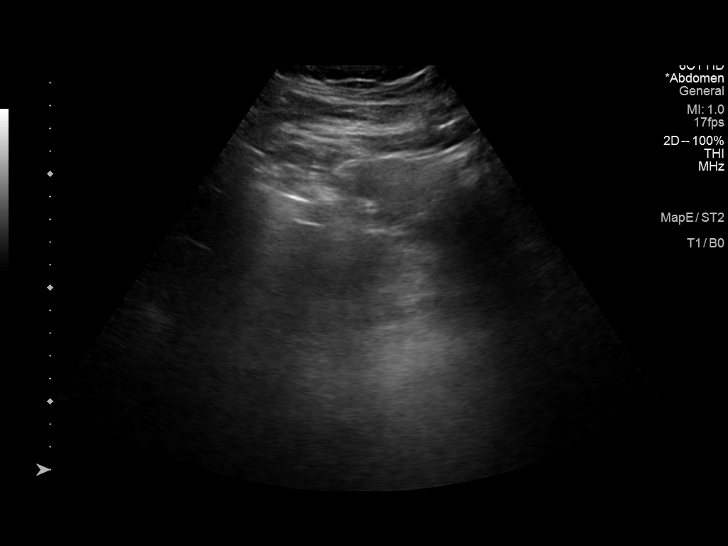
[im 51/88]
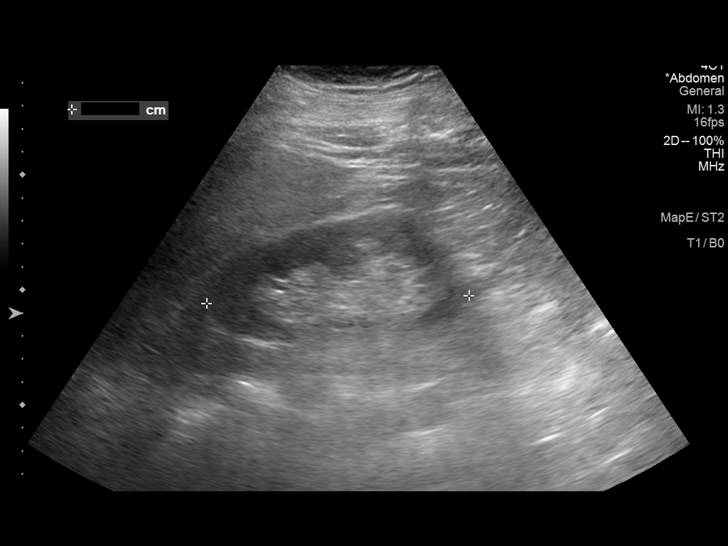
[im 59/88]
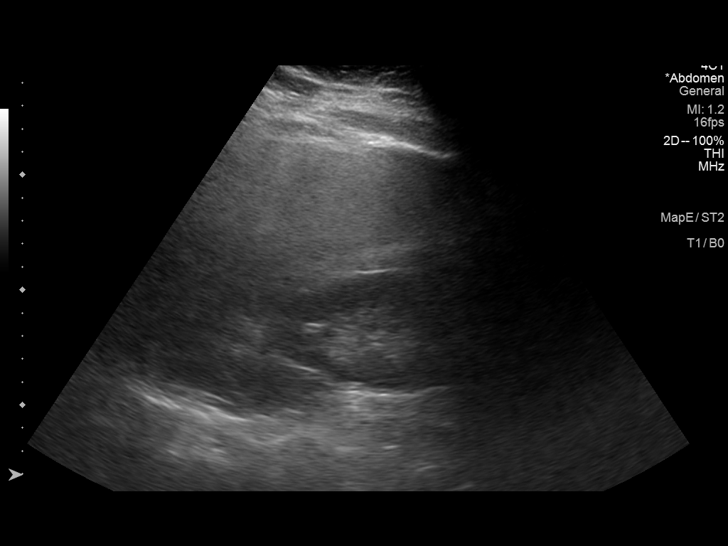
[im 66/88]
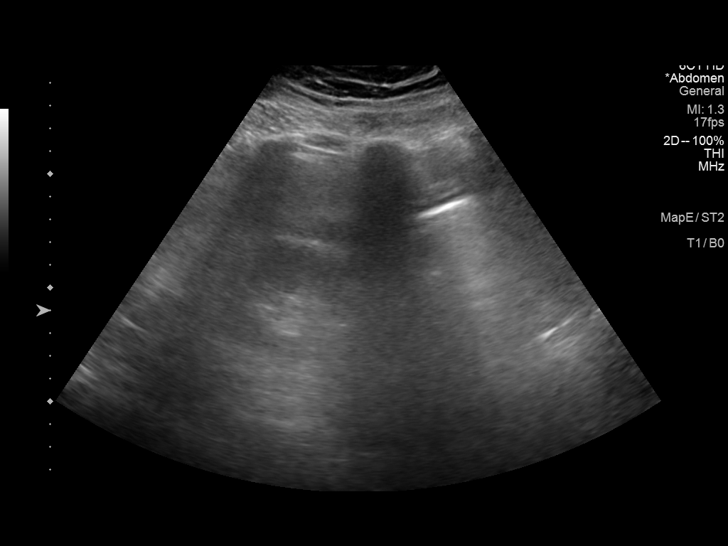
[im 73/88]
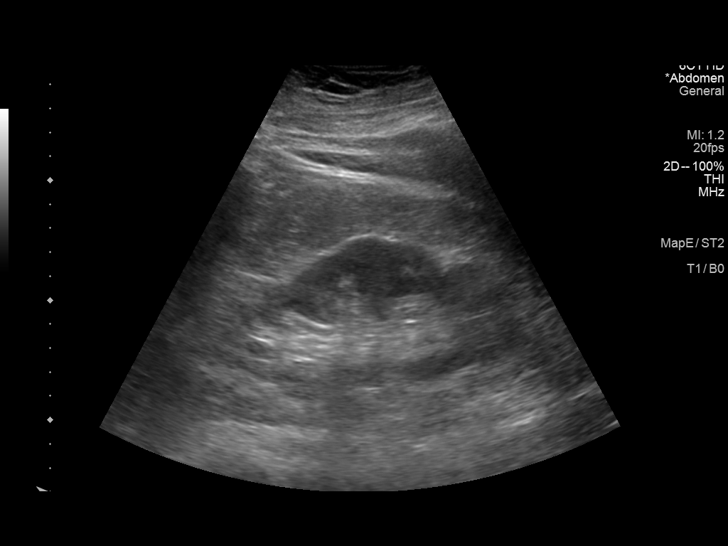
[im 80/88]
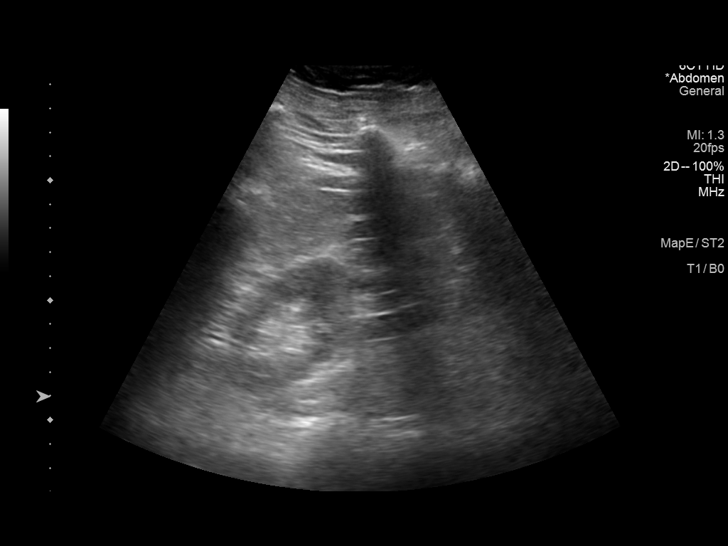
[im 88/88]
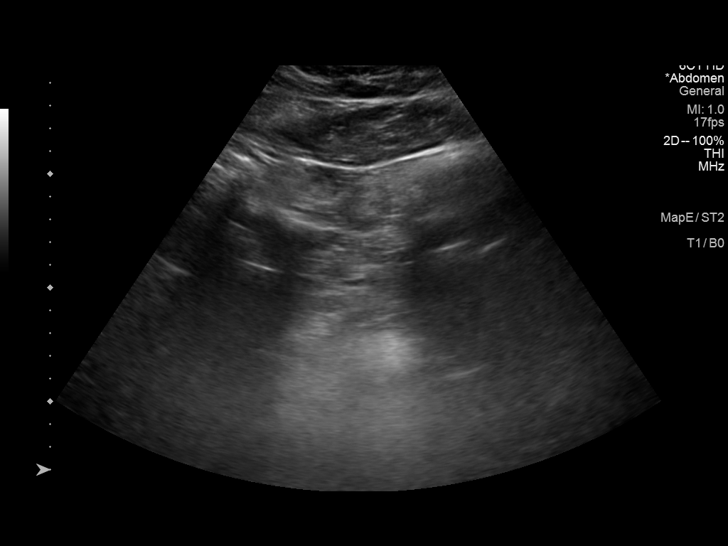

[13 of 25 positions shown; findings below may reference images not displayed]

FINDINGS: Gallbladder: No gallstones or wall thickening visualized. There is
no pericholecystic fluid. No sonographic Murphy sign noted by
sonographer.

Common bile duct: Diameter: 2 mm. No intrahepatic or extrahepatic
biliary duct dilatation. Note that portions of the common bile duct
are obscured by gas.

Liver: No focal lesion identified. Liver echogenicity is increased
diffusely period portal vein is patent on color Doppler imaging with
normal direction of blood flow towards the liver.

IVC: Essentially completely obscured by gas and attenuation due to
the marked fatty change in the liver.

Pancreas: Essentially completely obscured by gas.

Spleen: Size and appearance within normal limits.

Right Kidney: Length: 11.8 cm. Echogenicity within normal limits. No
mass or hydronephrosis visualized.

Left Kidney: Length: 13.6 cm. Echogenicity within normal limits. No
mass or hydronephrosis visualized.

Abdominal aorta: Essentially completely obscured by gas.

Other findings: No demonstrable ascites.
IMPRESSION: 1. Diffuse increase in liver echogenicity, a finding indicative of
hepatic steatosis. While no focal liver lesions are evident on this
study, it must be cautioned that the sensitivity of ultrasound for
detection of focal lesions is diminished significantly in this
circumstance.

2. Pancreas, aorta, and inferior vena cava obscured by gas and
attenuation due to marked fatty liver. Limited visualization of
biliary ductal system due to similar factors.

3.  Study otherwise unremarkable.

## 2020-07-01 ENCOUNTER — Encounter: Payer: Self-pay | Admitting: Gastroenterology

## 2020-07-07 ENCOUNTER — Encounter: Payer: Self-pay | Admitting: Gastroenterology

## 2020-08-25 ENCOUNTER — Telehealth: Payer: Self-pay | Admitting: Internal Medicine

## 2020-08-25 NOTE — Telephone Encounter (Signed)
Patient has been contacted at least 3 times for a recall, recall has been removed

## 2020-09-07 ENCOUNTER — Ambulatory Visit (AMBULATORY_SURGERY_CENTER): Payer: Commercial Managed Care - PPO

## 2020-09-07 ENCOUNTER — Other Ambulatory Visit: Payer: Self-pay

## 2020-09-07 ENCOUNTER — Other Ambulatory Visit: Payer: Self-pay | Admitting: Gastroenterology

## 2020-09-07 VITALS — Ht 74.0 in | Wt 341.5 lb

## 2020-09-07 DIAGNOSIS — Z86010 Personal history of colonic polyps: Secondary | ICD-10-CM

## 2020-09-07 MED ORDER — NA SULFATE-K SULFATE-MG SULF 17.5-3.13-1.6 GM/177ML PO SOLN: 1.0000 | Freq: Once | ORAL | 0 refills | Status: AC

## 2020-09-07 NOTE — Telephone Encounter (Signed)
See attached.  Thank you

## 2020-09-07 NOTE — Progress Notes (Signed)
Pt verified name, DOB, address and insurance during PV today.   Pt mailed instruction packet to include copy of consent form to read and not return, and instructions. PV completed over the phone. Pt encouraged to call with questions or issues.   No allergies to soy or egg Pt is not on blood thinners or diet pills Denies issues with sedation/intubation Denies atrial flutter/fib Denies constipation   Pt is aware of Covid safety and care partner requirements.  

## 2020-09-14 ENCOUNTER — Telehealth: Payer: Self-pay | Admitting: Gastroenterology

## 2020-09-14 DIAGNOSIS — Z86010 Personal history of colonic polyps: Secondary | ICD-10-CM

## 2020-09-14 MED ORDER — PEG 3350-KCL-NA BICARB-NACL 420 G PO SOLR: 4000.0000 mL | Freq: Once | ORAL | 0 refills | Status: AC

## 2020-09-14 NOTE — Telephone Encounter (Signed)
Inbound call from patient states he spoke with insurance and need pre approval for surprep. Insurance number [phone]

## 2020-09-14 NOTE — Telephone Encounter (Signed)
Pt does not want to pay for Suprep ($137), agreeable to Golyt[employer].  New prep instructions emailed to pt and Golytely sent to pharmacy

## 2020-09-21 ENCOUNTER — Other Ambulatory Visit: Payer: Self-pay

## 2020-09-21 ENCOUNTER — Encounter: Payer: Self-pay | Admitting: Gastroenterology

## 2020-09-21 ENCOUNTER — Ambulatory Visit (AMBULATORY_SURGERY_CENTER): Payer: Commercial Managed Care - PPO | Admitting: Gastroenterology

## 2020-09-21 VITALS — BP 146/87 | HR 76 | Temp 98.4°F | Resp 12 | Ht 72.0 in | Wt 341.0 lb

## 2020-09-21 DIAGNOSIS — D122 Benign neoplasm of ascending colon: Secondary | ICD-10-CM | POA: Diagnosis not present

## 2020-09-21 DIAGNOSIS — D125 Benign neoplasm of sigmoid colon: Secondary | ICD-10-CM

## 2020-09-21 DIAGNOSIS — D123 Benign neoplasm of transverse colon: Secondary | ICD-10-CM

## 2020-09-21 DIAGNOSIS — Z86010 Personal history of colonic polyps: Secondary | ICD-10-CM

## 2020-09-21 MED ORDER — SODIUM CHLORIDE 0.9 % IV SOLN: 500.0000 mL | Freq: Once | INTRAVENOUS | Status: DC

## 2020-09-21 NOTE — Progress Notes (Signed)
PT taken to PACU. Monitors in place. VSS. Report given to RN. 

## 2020-09-21 NOTE — Progress Notes (Signed)
C.W. vital signs. 

## 2020-09-21 NOTE — Patient Instructions (Signed)
Handout on polyps given. ° °YOU HAD AN ENDOSCOPIC PROCEDURE TODAY AT THE [facility]:   Refer to the procedure report that was given to you for any specific questions about what was found during the examination.  If the procedure report does not answer your questions, please call your gastroenterologist to clarify.  If you requested that your care partner not be given the details of your procedure findings, then the procedure report has been included in a sealed envelope for you to review at your convenience later. ° °YOU SHOULD EXPECT: Some feelings of bloating in the abdomen. Passage of more gas than usual.  Walking can help get rid of the air that was put into your GI tract during the procedure and reduce the bloating. If you had a lower endoscopy (such as a colonoscopy or flexible sigmoidoscopy) you may notice spotting of blood in your stool or on the toilet paper. If you underwent a bowel prep for your procedure, you may not have a normal bowel movement for a few days. ° °Please Note:  You might notice some irritation and congestion in your nose or some drainage.  This is from the oxygen used during your procedure.  There is no need for concern and it should clear up in a day or so. ° °SYMPTOMS TO REPORT IMMEDIATELY: ° °Following lower endoscopy (colonoscopy or flexible sigmoidoscopy): ° Excessive amounts of blood in the stool ° Significant tenderness or worsening of abdominal pains ° Swelling of the abdomen that is new, acute ° Fever of 100°F or higher ° °For urgent or emergent issues, a gastroenterologist can be reached at any hour by calling [phone]. °Do not use MyChart messaging for urgent concerns.  ° ° °DIET:  We do recommend a small meal at first, but then you may proceed to your regular diet.  Drink plenty of fluids but you should avoid alcoholic beverages for 24 hours. ° °ACTIVITY:  You should plan to take it easy for the rest of today and you should NOT DRIVE or use heavy machinery  until tomorrow (because of the sedation medicines used during the test).   ° °FOLLOW UP: °Our staff will call the number listed on your records 48-72 hours following your procedure to check on you and address any questions or concerns that you may have regarding the information given to you following your procedure. If we do not reach you, we will leave a message.  We will attempt to reach you two times.  During this call, we will ask if you have developed any symptoms of COVID 19. If you develop any symptoms (ie: fever, flu-like symptoms, shortness of breath, cough etc.) before then, please call [phone].  If you test positive for Covid 19 in the 2 weeks post procedure, please call and report this information to us.   ° °If any biopsies were taken you will be contacted by phone or by letter within the next 1-3 weeks.  Please call us at [phone] if you have not heard about the biopsies in 3 weeks.  ° ° °SIGNATURES/CONFIDENTIALITY: °You and/or your care partner have signed paperwork which will be entered into your electronic medical record.  These signatures attest to the fact that that the information above on your After Visit Summary has been reviewed and is understood.  Full responsibility of the confidentiality of this discharge information lies with you and/or your care-partner.  °

## 2020-09-21 NOTE — Progress Notes (Signed)
Pt's states no medical or surgical changes since previsit or office visit. 

## 2020-09-21 NOTE — Progress Notes (Signed)
Called to room to assist during endoscopic procedure.  Patient ID and intended procedure confirmed with present staff. Received instructions for my participation in the procedure from the performing physician.  

## 2020-09-21 NOTE — Op Note (Signed)
[facility] Patient Name: Jordan Reynolds Procedure Date: 09/21/2020 1:16 PM MRN: [id number] Endoscopist: Milus Banister , MD Age: 65 Referring MD:  Date of Birth: 10-06-1955 Gender: Male Account #: [id number] Procedure:                Colonoscopy Indications:              High risk colon cancer surveillance: Personal                            history of colonic polyps; Colonoscopy 2016 three                            subCM polyps, two were adenomas Medicines:                Monitored Anesthesia Care Procedure:                Pre-Anesthesia Assessment:                           - Prior to the procedure, a History and Physical                            was performed, and patient medications and                            allergies were reviewed. The patient's tolerance of                            previous anesthesia was also reviewed. The risks                            and benefits of the procedure and the sedation                            options and risks were discussed with the patient.                            All questions were answered, and informed consent                            was obtained. Prior Anticoagulants: The patient has                            taken no previous anticoagulant or antiplatelet                            agents. ASA Grade Assessment: III - A patient with                            severe systemic disease. After reviewing the risks                            and benefits, the patient was deemed in  satisfactory condition to undergo the procedure.                           After obtaining informed consent, the colonoscope                            was passed under direct vision. Throughout the                            procedure, the patient's blood pressure, pulse, and                            oxygen saturations were monitored continuously. The                            Olympus [id number]-[id number])  Colonoscope was                            introduced through the anus and advanced to the the                            cecum, identified by appendiceal orifice and                            ileocecal valve. The colonoscopy was performed                            without difficulty. The patient tolerated the                            procedure well. The quality of the bowel                            preparation was good. The ileocecal valve,                            appendiceal orifice, and rectum were photographed. Scope In: 1:33:06 PM Scope Out: 1:48:20 PM Scope Withdrawal Time: 0 hours 12 minutes 39 seconds  Total Procedure Duration: 0 hours 15 minutes 14 seconds  Findings:                 Eight sessile polyps were found in the descending                            colon, transverse colon and ascending colon. The                            polyps were 2 to 6 mm in size. These polyps were                            removed with a cold snare. Resection and retrieval                            were complete.  The exam was otherwise without abnormality on                            direct and retroflexion views. Complications:            No immediate complications. Estimated blood loss:                            None. Estimated Blood Loss:     Estimated blood loss: none. Impression:               - Eight 2 to 6 mm polyps in the descending colon,                            in the transverse colon and in the ascending colon,                            removed with a cold snare. Resected and retrieved.                           - The examination was otherwise normal on direct                            and retroflexion views. Recommendation:           - Patient has a contact number available for                            emergencies. The signs and symptoms of potential                            delayed complications were discussed with the                             patient. Return to normal activities tomorrow.                            Written discharge instructions were provided to the                            patient.                           - Resume previous diet.                           - Continue present medications.                           - Await pathology results. Milus Banister, MD 09/21/2020 1:51:09 PM This report has been signed electronically.

## 2020-09-23 ENCOUNTER — Telehealth: Payer: Self-pay | Admitting: *Deleted

## 2020-09-23 NOTE — Telephone Encounter (Signed)
Follow up call made. 

## 2020-09-23 NOTE — Telephone Encounter (Signed)
  Follow up Call-  Call back number 09/21/2020  Post procedure Call Back phone  # [phone]  Permission to leave phone message Yes  Some recent data might be hidden     Patient questions:  Do you have a fever, pain , or abdominal swelling? No. Pain Score  0 *  Have you tolerated food without any problems? Yes.    Have you been able to return to your normal activities? Yes.    Do you have any questions about your discharge instructions: Diet   No. Medications  No. Follow up visit  No.  Do you have questions or concerns about your Care? No.  Actions: * If pain score is 4 or above: No action needed, pain <4.   Have you developed a fever since your procedure? no  2.   Have you had an respiratory symptoms (SOB or cough) since your procedure? no  3.   Have you tested positive for COVID 19 since your procedure no  4.   Have you had any family members/close contacts diagnosed with the COVID 19 since your procedure?  no   If yes to any of these questions please route to Joylene John, RN and Joella Prince, RN

## 2020-09-28 ENCOUNTER — Encounter: Payer: Self-pay | Admitting: Gastroenterology

## 2020-11-18 ENCOUNTER — Observation Stay
Admission: EM | Admit: 2020-11-18 | Discharge: 2020-11-19 | Disposition: A | Discharge status: Home / Self Care | Payer: Commercial Managed Care - PPO | Attending: Obstetrics and Gynecology | Admitting: Obstetrics and Gynecology

## 2020-11-18 ENCOUNTER — Emergency Department: Payer: Commercial Managed Care - PPO

## 2020-11-18 ENCOUNTER — Encounter: Payer: Self-pay | Admitting: Emergency Medicine

## 2020-11-18 ENCOUNTER — Other Ambulatory Visit: Payer: Self-pay

## 2020-11-18 DIAGNOSIS — Z87891 Personal history of nicotine dependence: Secondary | ICD-10-CM | POA: Insufficient documentation

## 2020-11-18 DIAGNOSIS — N390 Urinary tract infection, site not specified: Principal | ICD-10-CM | POA: Diagnosis present

## 2020-11-18 DIAGNOSIS — R7303 Prediabetes: Secondary | ICD-10-CM | POA: Diagnosis not present

## 2020-11-18 DIAGNOSIS — A419 Sepsis, unspecified organism: Secondary | ICD-10-CM

## 2020-11-18 DIAGNOSIS — K760 Fatty (change of) liver, not elsewhere classified: Secondary | ICD-10-CM | POA: Diagnosis present

## 2020-11-18 DIAGNOSIS — Z20822 Contact with and (suspected) exposure to covid-19: Secondary | ICD-10-CM | POA: Insufficient documentation

## 2020-11-18 DIAGNOSIS — R300 Dysuria: Secondary | ICD-10-CM | POA: Diagnosis present

## 2020-11-18 DIAGNOSIS — Z79899 Other long term (current) drug therapy: Secondary | ICD-10-CM | POA: Insufficient documentation

## 2020-11-18 DIAGNOSIS — N3091 Cystitis, unspecified with hematuria: Secondary | ICD-10-CM

## 2020-11-18 DIAGNOSIS — N3090 Cystitis, unspecified without hematuria: Secondary | ICD-10-CM

## 2020-11-18 LAB — COMPREHENSIVE METABOLIC PANEL
ALT: 45 U/L — ABNORMAL HIGH (ref 0–44)
AST: 34 U/L (ref 15–41)
Albumin: 4.9 g/dL (ref 3.5–5.0)
Alkaline Phosphatase: 44 U/L (ref 38–126)
Anion gap: 12 (ref 5–15)
BUN: 18 mg/dL (ref 8–23)
CO2: 25 mmol/L (ref 22–32)
Calcium: 9.1 mg/dL (ref 8.9–10.3)
Chloride: 99 mmol/L (ref 98–111)
Creatinine, Ser: 1 mg/dL (ref 0.61–1.24)
GFR, Estimated: >60 mL/min (ref >60)
Glucose, Bld: 108 mg/dL — ABNORMAL HIGH (ref 70–99)
Potassium: 4.1 mmol/L (ref 3.5–5.1)
Sodium: 136 mmol/L (ref 135–145)
Total Bilirubin: 1.7 mg/dL — ABNORMAL HIGH (ref 0.3–1.2)
Total Protein: 7.7 g/dL (ref 6.5–8.1)

## 2020-11-18 LAB — CBC WITH DIFFERENTIAL/PLATELET
Abs Immature Granulocytes: 0.01 10*3/uL (ref 0.00–0.07)
Basophils Absolute: 0.1 10*3/uL (ref 0.0–0.1)
Basophils Relative: 1 %
Eosinophils Absolute: 0.1 10*3/uL (ref 0.0–0.5)
Eosinophils Relative: 1 %
HCT: 45.4 % (ref 39.0–52.0)
Hemoglobin: 15.2 g/dL (ref 13.0–17.0)
Immature Granulocytes: 0 %
Lymphocytes Relative: 16 %
Lymphs Abs: 1.1 10*3/uL (ref 0.7–4.0)
MCH: 30.1 pg (ref 26.0–34.0)
MCHC: 33.5 g/dL (ref 30.0–36.0)
MCV: 89.9 fL (ref 80.0–100.0)
Monocytes Absolute: 0.2 10*3/uL (ref 0.1–1.0)
Monocytes Relative: 2 %
Neutro Abs: 5.2 10*3/uL (ref 1.7–7.7)
Neutrophils Relative %: 80 %
Platelets: 148 10*3/uL — ABNORMAL LOW (ref 150–400)
RBC: 5.05 MIL/uL (ref 4.22–5.81)
RDW: 13.2 % (ref 11.5–15.5)
WBC: 6.6 10*3/uL (ref 4.0–10.5)
nRBC: 0 % (ref 0.0–0.2)

## 2020-11-18 LAB — RESP PANEL BY RT-PCR (FLU A&B, COVID) ARPGX2
Influenza A by PCR: NEGATIVE
Influenza B by PCR: NEGATIVE
SARS Coronavirus 2 by RT PCR: NEGATIVE

## 2020-11-18 LAB — URINALYSIS, COMPLETE (UACMP) WITH MICROSCOPIC
Bacteria, UA: NONE SEEN
RBC / HPF: >50 RBC/hpf — ABNORMAL HIGH (ref 0–5)
Specific Gravity, Urine: 1.031 — ABNORMAL HIGH (ref 1.005–1.030)
Squamous Epithelial / HPF: NONE SEEN (ref 0–5)
WBC, UA: >50 WBC/hpf — ABNORMAL HIGH (ref 0–5)

## 2020-11-18 LAB — LACTIC ACID, PLASMA
Lactic Acid, Venous: 2.3 mmol/L (ref 0.5–1.9)
Lactic Acid, Venous: 1.1 mmol/L (ref 0.5–1.9)

## 2020-11-18 MED ORDER — ONDANSETRON HCL 4 MG/2ML IJ SOLN: 4.0000 mg | Freq: Four times a day (QID) | INTRAMUSCULAR | Status: DC | PRN

## 2020-11-18 MED ORDER — SODIUM CHLORIDE 0.9 % IV SOLN
2.0000 g | INTRAVENOUS | Status: DC
  Administered 2020-11-18: 2 g via INTRAVENOUS
  Filled 2020-11-18: qty 20

## 2020-11-18 MED ORDER — ONDANSETRON HCL 4 MG PO TABS: 4.0000 mg | ORAL_TABLET | Freq: Four times a day (QID) | ORAL | Status: DC | PRN

## 2020-11-18 MED ORDER — SENNOSIDES-DOCUSATE SODIUM 8.6-50 MG PO TABS: 1.0000 | ORAL_TABLET | Freq: Every evening | ORAL | Status: DC | PRN

## 2020-11-18 MED ORDER — ACETAMINOPHEN 500 MG PO TABS
1000.0000 mg | ORAL_TABLET | Freq: Once | ORAL | Status: DC
  Filled 2020-11-18: qty 2

## 2020-11-18 MED ORDER — ACETAMINOPHEN 650 MG RE SUPP: 650.0000 mg | Freq: Four times a day (QID) | RECTAL | Status: DC | PRN

## 2020-11-18 MED ORDER — ACETAMINOPHEN 325 MG PO TABS: 650.0000 mg | ORAL_TABLET | Freq: Four times a day (QID) | ORAL | Status: DC | PRN

## 2020-11-18 MED ORDER — LACTATED RINGERS IV BOLUS (SEPSIS)
1000.0000 mL | Freq: Once | INTRAVENOUS | Status: AC
  Administered 2020-11-18: 1000 mL via INTRAVENOUS

## 2020-11-18 MED ORDER — MELATONIN 5 MG PO TABS: 2.5000 mg | ORAL_TABLET | Freq: Every evening | ORAL | Status: DC | PRN

## 2020-11-18 MED ORDER — LACTATED RINGERS IV SOLN: INTRAVENOUS | Status: AC

## 2020-11-18 MED ORDER — IBUPROFEN 600 MG PO TABS
600.0000 mg | ORAL_TABLET | Freq: Once | ORAL | Status: AC
  Administered 2020-11-18: 600 mg via ORAL
  Filled 2020-11-18: qty 1

## 2020-11-18 NOTE — ED Notes (Signed)
Pt transported to CT ?

## 2020-11-18 NOTE — ED Provider Notes (Signed)
[facility] Emergency Department Provider Note ____________________________________________   Event Date/Time   First MD Initiated Contact with Patient 11/18/20 1544     (approximate)  I have reviewed the triage vital signs and the nursing notes.   HISTORY  Chief Complaint Tremors    HPI Jordan Reynolds is a 65 y.o. male with PMH as noted below who presents with gross hematuria, acute onset this afternoon, and preceded by dysuria and hesitancy for the last few days.  He denies any associated abdominal or flank pain.  He then developed rigors and a fever.  The patient states that initially started with dysuria a few days ago but had no suprapubic or flank pain at that time.  The symptoms got worse so he went to the walk-in clinic this morning, had a urinalysis, and was told that he had a UTI.  He was prescribed Cipro and went to the pharmacy to pick it up and get a COVID booster shot.  He took the first dose of antibiotic and noted he was having some body aches.  Shortly after getting home he felt that he had to urinate and noticed gross hematuria which has persisted since then.  On the way here the patient developed shakes and rigors which have now resolved.  He took some Tylenol at home prior to coming to the ED.   Past Medical History:  Diagnosis Date   Allergic rhinitis    Ex-smoker 1998   History of chicken pox    Inguinal hernia    right   Primary syphilis 10/13/2014   treated    Patient Active Problem List   Diagnosis Date Noted   Complicated UTI (urinary tract infection) 11/18/2020   Shortness of breath 01/30/2020   Morbid obesity ([place]) 01/30/2020   Lumbar radiculopathy 11/02/2019   Hepatic steatosis 01/21/2019   LUQ abdominal pain 01/10/2019   Neck pain on left side 12/18/2018   HLD (hyperlipidemia) 10/27/2016   Dry skin 10/27/2016   Right inguinal hernia 10/27/2016   Prediabetes 10/18/2015   Obesity, Class II, BMI 35-39.9, no  comorbidity 10/10/2013   Encounter for well adult exam with abnormal findings 10/09/2012    Past Surgical History:  Procedure Laterality Date   COLONOSCOPY  03/2015   TA, rpt 5 yrs Ardis Hughs)   DENTAL SURGERY  2014   implants   INGUINAL HERNIA REPAIR Right 05/14/2017   Procedure: OPEN RIGHT INGUINAL HERNIA REPAIR WITH MESH;  Surgeon: Judeth Horn, MD;  Location: [facility];  Service: General;  Laterality: Right;   INSERTION OF MESH Right 05/14/2017   Procedure: INSERTION OF MESH;  Surgeon: Judeth Horn, MD;  Location: [facility];  Service: General;  Laterality: Right;   [place]    Prior to Admission medications   Medication Sig Start Date End Date Taking? Authorizing Provider  Multiple Vitamin (MULTIVITAMIN) tablet Take 1 tablet by mouth daily.   Yes [provider]  albuterol (VENTOLIN HFA) 108 (90 Base) MCG/ACT inhaler Inhale 2 puffs into the lungs every 6 (six) hours as needed for wheezing or shortness of breath. 09/23/20   [provider]  Ascorbic Acid (VITAMIN C PO) Take by mouth. Takes 5,000 mg powder (1 teaspoon) Patient not taking: Reported on 11/18/2020    [provider]  ciprofloxacin (CIPRO) 500 MG tablet Take 500 mg by mouth 2 (two) times daily. 11/18/20   [provider]  Methylsulfonylmethane (MSM) POWD Take by mouth. Takes 10,000 mg daily  Patient not taking: Reported on 11/18/2020    [provider]  montelukast (SINGULAIR) 10 MG tablet  08/23/20   [provider]    Allergies Patient has no known allergies.  Family History  Problem Relation Age of Onset   Other Mother        benign brain tumor   Diabetes Mother        early   Stroke Mother    Hyperlipidemia Mother    CAD Father 64       unsure, deceased   Cancer Father 80       prostate   Hypertension Father    Colon polyps Father    Other Sister 59       some brainstem compression issue   CAD Brother        3  stents placed    Hyperlipidemia Brother    Colon cancer Neg Hx    Esophageal cancer Neg Hx    Stomach cancer Neg Hx    Rectal cancer Neg Hx     Social History Social History   Tobacco Use   Smoking status: Former    Packs/day: 2.00    Years: 25.00    Pack years: 50.00    Types: Cigarettes    Quit date: 05/07/1996    Years since quitting: 24.5   Smokeless tobacco: Never   Tobacco comments:    quit October 1998  Vaping Use   Vaping Use: Never used  Substance Use Topics   Alcohol use: Yes    Alcohol/week: 6.0 standard drinks    Types: 6 Cans of beer per week   Drug use: No    Review of Systems  Constitutional: Positive for fever. Eyes: No redness. ENT: No sore throat. Cardiovascular: Denies chest pain. Respiratory: Denies shortness of breath.  Positive for mild cough. Gastrointestinal: No vomiting or diarrhea.  Genitourinary: Positive for dysuria and hematuria. Musculoskeletal: Negative for back pain. Skin: Negative for rash. Neurological: Negative for headache.   ____________________________________________   PHYSICAL EXAM:  VITAL SIGNS: ED Triage Vitals  Enc Vitals Group     BP 11/18/20 1540 (!) 148/105     Pulse Rate 11/18/20 1540 96     Resp 11/18/20 1540 20     Temp 11/18/20 1540 (!) 100.7 F (38.2 C)     Temp Source 11/18/20 1540 Oral     SpO2 11/18/20 1540 97 %     Weight 11/18/20 1536 (!) 334 lb (151.5 kg)     Height 11/18/20 1536 '6\' 1"'$  (1.854 m)     Head Circumference --      Peak Flow --      Pain Score 11/18/20 1535 4     Pain Loc --      Pain Edu? --      Excl. in [place]? --     Constitutional: Alert and oriented. Well appearing and in no acute distress. Eyes: Conjunctivae are normal.  Head: Atraumatic. Nose: No congestion/rhinnorhea. Mouth/Throat: Mucous membranes are moist.   Neck: Normal range of motion.  Cardiovascular: Tachycardic, regular rhythm. Good peripheral circulation. Respiratory: Normal respiratory effort.  No  retractions. Gastrointestinal: Soft and nontender. No distention.  Genitourinary: No flank tenderness. Musculoskeletal: Extremities warm and well perfused.  Neurologic:  Normal speech and language. No gross focal neurologic deficits are appreciated.  Skin:  Skin is warm and dry. No rash noted. Psychiatric: Mood and affect are normal. Speech and behavior are normal.  ____________________________________________   LABS (all labs ordered  are listed, but only abnormal results are displayed)  Labs Reviewed  LACTIC ACID, PLASMA - Abnormal; Notable for the following components:      Result Value   Lactic Acid, Venous 2.3 (*)    All other components within normal limits  COMPREHENSIVE METABOLIC PANEL - Abnormal; Notable for the following components:   Glucose, Bld 108 (*)    ALT 45 (*)    Total Bilirubin 1.7 (*)    All other components within normal limits  CBC WITH DIFFERENTIAL/PLATELET - Abnormal; Notable for the following components:   Platelets 148 (*)    All other components within normal limits  URINALYSIS, COMPLETE (UACMP) WITH MICROSCOPIC - Abnormal; Notable for the following components:   Color, Urine   (*)    Value: TEST NOT REPORTED DUE TO COLOR INTERFERENCE OF URINE PIGMENT   APPearance   (*)    Value: TEST NOT REPORTED DUE TO COLOR INTERFERENCE OF URINE PIGMENT   Specific Gravity, Urine 1.031 (*)    Glucose, UA   (*)    Value: TEST NOT REPORTED DUE TO COLOR INTERFERENCE OF URINE PIGMENT   Hgb urine dipstick   (*)    Value: TEST NOT REPORTED DUE TO COLOR INTERFERENCE OF URINE PIGMENT   Bilirubin Urine   (*)    Value: TEST NOT REPORTED DUE TO COLOR INTERFERENCE OF URINE PIGMENT   Ketones, ur   (*)    Value: TEST NOT REPORTED DUE TO COLOR INTERFERENCE OF URINE PIGMENT   Protein, ur   (*)    Value: TEST NOT REPORTED DUE TO COLOR INTERFERENCE OF URINE PIGMENT   Nitrite   (*)    Value: TEST NOT REPORTED DUE TO COLOR INTERFERENCE OF URINE PIGMENT   Leukocytes,Ua   (*)     Value: TEST NOT REPORTED DUE TO COLOR INTERFERENCE OF URINE PIGMENT   RBC / HPF >50 (*)    WBC, UA >50 (*)    All other components within normal limits  RESP PANEL BY RT-PCR (FLU A&B, COVID) ARPGX2  URINE CULTURE  CULTURE, BLOOD (ROUTINE X 2)  CULTURE, BLOOD (ROUTINE X 2)  LACTIC ACID, PLASMA  HIV ANTIBODY (ROUTINE TESTING W REFLEX)  COMPREHENSIVE METABOLIC PANEL  CBC   ____________________________________________  EKG  ED ECG REPORT I, Arta Silence, the attending physician, personally viewed and interpreted this ECG.  Date: 11/18/2020 EKG Time: 1539 Rate: 115 Rhythm: Sinus tachycardia QRS Axis: normal Intervals: normal ST/T Wave abnormalities: Nonspecific abnormalities, interpretation limited due to poor EKG baseline Narrative Interpretation: Nonspecific abnormalities with no evidence of acute ischemia  ____________________________________________  RADIOLOGY  Chest x-ray interpreted by me shows no focal consolidation or edema CT abdomen/pelvis: Findings consistent with cystitis.  No kidney stones or other acute abnormality.  ____________________________________________   PROCEDURES  Procedure(s) performed: No  Procedures  Critical Care performed: No ____________________________________________   INITIAL IMPRESSION / ASSESSMENT AND PLAN / ED COURSE  Pertinent labs & imaging results that were available during my care of the patient were reviewed by me and considered in my medical decision making (see chart for details).   65 year old male with PMH as noted above presents with gross hematuria after experiencing dysuria for the last several days and being diagnosed with a UTI today.  He also developed some body aches, rigors and shakes this afternoon.  The patient received his COVID booster shot this afternoon as well, however this was right before the body aches and fever started.  On exam, the patient is overall well-appearing.  He is slightly tachycardic  and is febrile, with otherwise normal vital signs.  Abdomen is soft and nontender.  There is no flank tenderness.  Physical exam is otherwise unremarkable.  The patient currently is not having any rigors or shakes.  Neurologic exam is nonfocal.  Overall presentation is consistent with acute cystitis with possible systemic involvement or sepsis, however there is no evidence of pyelonephritis.  Differential includes other source of infection such as pneumonia or COVID-19.  I doubt that this was a vaccination reaction given that it was only 1 to 2 hours from when the patient had the booster and typically we would expect a more delayed immune response to the vaccine.  We will obtain sepsis labs, chest x-ray, CT abdomen/pelvis, give fluids, and reassess.  ----------------------------------------- 6:57 PM on 11/18/2020 -----------------------------------------  CT confirmed findings consistent with cystitis but no kidney stones or other acute findings.  The patient's urine began to clear and he is no longer having gross hematuria or clots.  I consulted Dr. Diamantina Providence from urology who advised that there was no indication to place a Foley if the patient's urine was clearing.  Given the tachycardia, fever, and elevated lactate, I recommended that we admit the patient for IV antibiotics and observation and he agrees.  I consulted Dr. Posey Pronto from the hospitalist service for admission.   ____________________________________________   FINAL CLINICAL IMPRESSION(S) / ED DIAGNOSES  Final diagnoses:  Cystitis      NEW MEDICATIONS STARTED DURING THIS VISIT:  New Prescriptions   No medications on file     Note:  This document was prepared using Dragon voice recognition software and may include unintentional dictation errors.    Arta Silence, MD 11/18/20 2337

## 2020-11-18 NOTE — ED Notes (Signed)
Pt walked to bathroom, gait steady.

## 2020-11-18 NOTE — ED Triage Notes (Signed)
Pt was just diagnosised with a UTI, now has a fever.

## 2020-11-18 NOTE — ED Notes (Signed)
CRITICAL VALUE STICKER  CRITICAL VALUE: Lactic 2.3  RECEIVER (on-site recipient of call): M Hezzie Karim RN  DATE & TIME NOTIFIED: 4:48 PM  MESSENGER (representative from lab):  MD NOTIFIED: Siadecki  TIME OF NOTIFICATION: 4:49 PM  RESPONSE:

## 2020-11-18 NOTE — H&P (Signed)
History and Physical    Jordan Reynolds [id number] DOB: 04/18/1955 DOA: 11/18/2020  PCP: Ria Bush, MD  Patient coming from: Home  I have personally briefly reviewed patient's old medical records in [place]  Chief Complaint: Dysuria, rigors  HPI: Jordan Reynolds is a 65 y.o. male with medical history significant for morbid obesity who presented to the ED for evaluation of dysuria and rigors.  Patient reports new onset of dysuria and increased urinary frequency beginning 3 days ago.  Over the last day he developed feelings of incomplete bladder emptying.  He went to the walk-in clinic earlier today (11/18/2020).  He was diagnosed with a UTI (urinalysis showed negative nitrites, moderate leukocytes, >50 WBCs, >50 RBCs, rare bacteria).  He was prescribed a course of ciprofloxacin.  Before leaving he was also given a COVID-19 booster shot.  Patient states that he took the first dose of ciprofloxacin.  When he urinated afterwards he noticed blood clots in his urine which he had not seen earlier.  About an hour later he developed rigors and diaphoresis.  He subsequently presented to the ED for further evaluation.  Patient otherwise denies any chest pain, dyspnea, nausea, vomiting, abdominal pain.  He states that he does not take any medications regularly other than multivitamin.  He is a former smoker of 25 years and quit in 1998.  ED Course:  Initial vitals showed BP 148/105, pulse 96, RR 20, temp 100.7 F, SPO2 97% room air.  While in the ED patient became tachycardic with rate up to 122.  Labs show WBC 6.6, hemoglobin 15.2, platelets 148,000, sodium 136, potassium 4.1, bicarb 25, BUN 18, creatinine 1.00, serum glucose 108.  Initial lactic acid 2.3, improved to 1.1 after fluids.  Urinalysis report was limited due to hematuria.  Urine culture was obtained and in process.  SARS-CoV-2 PCR is negative.  Influenza is negative.  2 view chest x-ray negative for focal consolidation,  edema, or effusion.  CT abdomen/pelvis without contrast showed bladder wall thickening with adjacent stranding.  No hydronephrosis or nephrolithiasis.  Possible small bilateral renal lesions seen, incompletely evaluated on noncontrast CT.  Hepatic steatosis also noted.  Patient was given 1 L LR and started on IV ceftriaxone.  EDP discussed with on-call urology who recommended against placing Foley catheter as hematuria appears to be clearing up.  The hospitalist service was consulted to admit for further evaluation and management.  Review of Systems: All systems reviewed and are negative except as documented in history of present illness above.   Past Medical History:  Diagnosis Date   Allergic rhinitis    Ex-smoker 1998   History of chicken pox    Inguinal hernia    right   Primary syphilis 10/13/2014   treated    Past Surgical History:  Procedure Laterality Date   COLONOSCOPY  03/2015   TA, rpt 5 yrs Ardis Hughs)   DENTAL SURGERY  2014   implants   INGUINAL HERNIA REPAIR Right 05/14/2017   Procedure: OPEN RIGHT INGUINAL HERNIA REPAIR WITH MESH;  Surgeon: Judeth Horn, MD;  Location: [facility];  Service: General;  Laterality: Right;   INSERTION OF MESH Right 05/14/2017   Procedure: INSERTION OF MESH;  Surgeon: Judeth Horn, MD;  Location: [facility];  Service: General;  Laterality: Right;   TONSILLECTOMY  1965    Social History:  reports that he quit smoking about 24 years ago. His smoking use included cigarettes. He has a 50.00 pack-year smoking  history. He has never used smokeless tobacco. He reports current alcohol use of about 6.0 standard drinks per week. He reports that he does not use drugs.  No Known Allergies  Family History  Problem Relation Age of Onset   Other Mother        benign brain tumor   Diabetes Mother        early   Stroke Mother    Hyperlipidemia Mother    CAD Father 52       unsure, deceased   Cancer Father 2        prostate   Hypertension Father    Colon polyps Father    Other Sister 37       some brainstem compression issue   CAD Brother        3 stents placed    Hyperlipidemia Brother    Colon cancer Neg Hx    Esophageal cancer Neg Hx    Stomach cancer Neg Hx    Rectal cancer Neg Hx      Prior to Admission medications   Medication Sig Start Date End Date Taking? Authorizing Provider  Amoxicillin-Pot Clavulanate (AUGMENTIN PO) Take by mouth every 6 (six) hours as needed. Patient not taking: No sig reported    [provider]  Ascorbic Acid (VITAMIN C PO) Take by mouth. Takes 5,000 mg powder (1 teaspoon)    [provider]  Methylsulfonylmethane (MSM) POWD Take by mouth. Takes 10,000 mg daily    [provider]  montelukast (SINGULAIR) 10 MG tablet SMARTSIG:1 Tablet(s) By Mouth Every Evening 08/23/20   [provider]  Multiple Vitamin (MULTIVITAMIN) tablet Take 1 tablet by mouth daily.    [provider]    Physical Exam: Vitals:   11/18/20 1540 11/18/20 1621 11/18/20 1728 11/18/20 1827  BP: (!) 148/105 (!) 144/94 140/90 128/67  Pulse: 96 (!) 122 (!) 104 93  Resp: '20 18 16 16  '$ Temp: (!) 100.7 F (38.2 C)  99.5 F (37.5 C)   TempSrc: Oral  Oral   SpO2: 97% 94% 94% 95%  Weight:      Height:       Constitutional: Obese man resting in bed, NAD, calm, comfortable Eyes: PERRL, lids and conjunctivae normal ENMT: Mucous membranes are moist. Posterior pharynx clear of any exudate or lesions.Normal dentition.  Neck: normal, supple, no masses. Respiratory: clear to auscultation bilaterally, no wheezing, no crackles. Normal respiratory effort. No accessory muscle use.  Cardiovascular: Regular rate and rhythm, no murmurs / rubs / gallops. No extremity edema. 2+ pedal pulses. Abdomen: Mild suprapubic tenderness, no masses palpated. No hepatosplenomegaly. Bowel sounds positive.  Musculoskeletal: no clubbing / cyanosis. No joint deformity upper and  lower extremities. Good ROM, no contractures. Normal muscle tone.  Skin: Diaphoretic, no rashes, lesions, ulcers. No induration Neurologic: CN 2-12 grossly intact. Sensation intact. Strength 5/5 in all 4.  Psychiatric: Normal judgment and insight. Alert and oriented x 3. Normal mood.   Labs on Admission: I have personally reviewed following labs and imaging studies  CBC: Recent Labs  Lab 11/18/20 1600  WBC 6.6  NEUTROABS 5.2  HGB 15.2  HCT 45.4  MCV 89.9  PLT 123456*   Basic Metabolic Panel: Recent Labs  Lab 11/18/20 1600  NA 136  K 4.1  CL 99  CO2 25  GLUCOSE 108*  BUN 18  CREATININE 1.00  CALCIUM 9.1   GFR: Estimated Creatinine Clearance: 116 mL/min (by C-G formula based on SCr of 1 mg/dL). Liver  Function Tests: Recent Labs  Lab 11/18/20 1600  AST 34  ALT 45*  ALKPHOS 44  BILITOT 1.7*  PROT 7.7  ALBUMIN 4.9   No results for input(s): LIPASE, AMYLASE in the last 168 hours. No results for input(s): AMMONIA in the last 168 hours. Coagulation Profile: No results for input(s): INR, PROTIME in the last 168 hours. Cardiac Enzymes: No results for input(s): CKTOTAL, CKMB, CKMBINDEX, TROPONINI in the last 168 hours. BNP (last 3 results) No results for input(s): PROBNP in the last 8760 hours. HbA1C: No results for input(s): HGBA1C in the last 72 hours. CBG: No results for input(s): GLUCAP in the last 168 hours. Lipid Profile: No results for input(s): CHOL, HDL, LDLCALC, TRIG, CHOLHDL, LDLDIRECT in the last 72 hours. Thyroid Function Tests: No results for input(s): TSH, T4TOTAL, FREET4, T3FREE, THYROIDAB in the last 72 hours. Anemia Panel: No results for input(s): VITAMINB12, FOLATE, FERRITIN, TIBC, IRON, RETICCTPCT in the last 72 hours. Urine analysis:    Component Value Date/Time   COLORURINE (A) 11/18/2020 1539    TEST NOT REPORTED DUE TO COLOR INTERFERENCE OF URINE PIGMENT   APPEARANCEUR (A) 11/18/2020 1539    TEST NOT REPORTED DUE TO COLOR INTERFERENCE OF  URINE PIGMENT   LABSPEC 1.031 (H) 11/18/2020 1539   PHURINE  11/18/2020 1539    TEST NOT REPORTED DUE TO COLOR INTERFERENCE OF URINE PIGMENT   GLUCOSEU (A) 11/18/2020 1539    TEST NOT REPORTED DUE TO COLOR INTERFERENCE OF URINE PIGMENT   HGBUR (A) 11/18/2020 1539    TEST NOT REPORTED DUE TO COLOR INTERFERENCE OF URINE PIGMENT   BILIRUBINUR (A) 11/18/2020 1539    TEST NOT REPORTED DUE TO COLOR INTERFERENCE OF URINE PIGMENT   KETONESUR (A) 11/18/2020 1539    TEST NOT REPORTED DUE TO COLOR INTERFERENCE OF URINE PIGMENT   PROTEINUR (A) 11/18/2020 1539    TEST NOT REPORTED DUE TO COLOR INTERFERENCE OF URINE PIGMENT   NITRITE (A) 11/18/2020 1539    TEST NOT REPORTED DUE TO COLOR INTERFERENCE OF URINE PIGMENT   LEUKOCYTESUR (A) 11/18/2020 1539    TEST NOT REPORTED DUE TO COLOR INTERFERENCE OF URINE PIGMENT    Radiological Exams on Admission: CT ABDOMEN PELVIS WO CONTRAST  Result Date: 11/18/2020 CLINICAL DATA:  Hematuria, unknown cause EXAM: CT ABDOMEN AND PELVIS WITHOUT CONTRAST TECHNIQUE: Multidetector CT imaging of the abdomen and pelvis was performed following the standard protocol without IV contrast. COMPARISON:  CT abdomen pelvis 04/05/2017 FINDINGS: Lower chest: Bibasilar hypoventilatory changes. Hepatobiliary: Hepatic steatosis. No gallstones, gallbladder wall thickening, or biliary dilatation. Pancreas: Unremarkable. No pancreatic ductal dilatation or surrounding inflammatory changes. Spleen: Normal in size without focal abnormality. Adrenals/Urinary Tract: Adrenal glands are unremarkable. No hydronephrosis or nephrolithiasis. Mild contour nodularity of the left mid kidney posteriorly as well as a hypodense area just superiorly, and another at the superior pole the right kidney which could represent small renal cysts. Stomach/Bowel: The stomach is unremarkable. There is no evidence of bowel obstruction. The appendix is normal. There is scattered colonic diverticuli. No bowel wall thickening  or significant inflammatory changes. Vascular/Lymphatic: Aortoiliac atherosclerotic calcifications. No AAA. No lymphadenopathy. Reproductive: Unremarkable. Other: Small fat containing left inguinal hernia.  No ascites. Musculoskeletal: No acute osseous abnormality. No suspicious lytic or blastic lesions. Multilevel degenerative changes of the spine. Bilateral hip degenerative changes. IMPRESSION: Mild bladder wall thickening with adjacent stranding, correlate with urinalysis for cystitis. No hydronephrosis or nephrolithiasis. Possible small bilateral renal lesions, incompletely evaluated on noncontrast CT, which could be  small cysts. This should be further evaluated with nonemergent, multiphase contrast enhanced CT (CT urogram) as an outpatient given the history of hematuria. Hepatic steatosis. Electronically Signed   By: Maurine Simmering M.D.   On: 11/18/2020 17:25   DG Chest 2 View  Result Date: 11/18/2020 CLINICAL DATA:  Your.  Has UTI.  Weakness. EXAM: CHEST - 2 VIEW COMPARISON:  Chest x-ray 10/13/2014. FINDINGS: The heart and mediastinal contours are unchanged. No focal consolidation. No pulmonary edema. No pleural effusion. No pneumothorax. No acute osseous abnormality. IMPRESSION: No active cardiopulmonary disease. Electronically Signed   By: Iven Finn M.D.   On: 11/18/2020 16:27    EKG: Personally reviewed. Sinus tachycardia, rate 115, motion artifact present.  Rate is faster when compared to prior.  Assessment/Plan Principal Problem:   Complicated UTI (urinary tract infection) Active Problems:   Hepatic steatosis   Jordan Reynolds is a 65 y.o. male with medical history significant for morbid obesity who is admitted with acute complicated UTI/cystitis with hematuria.  Acute complicated UTI/cystitis with hematuria: Presented with dysuria and frank hematuria.  Foley catheter deferred as hematuria is clearing up. -Follow urine culture, obtain blood cultures -Continue IV ceftriaxone -Continue  IV fluids overnight and monitor urine output/hematuria  Renal lesions: CT abdomen/pelvis without contrast shows possible small bilateral renal lesions.  Follow-up nonemergent CT urogram is recommended.  Hepatic steatosis: Seen on CT imaging.  Discussed findings with patient and recommended weight loss and dietary changes.  DVT prophylaxis: SCDs Code Status: Full code, confirmed with patient Family Communication: Discussed with patient, he has discussed with family Disposition Plan: From home and likely discharge to home pending clinical progress Consults called: None Level of care: Med-Surg Admission status:  Status is: Observation  The patient remains OBS appropriate and will d/c before 2 midnights.  Dispo: The patient is from: Home              Anticipated d/c is to: Home              Patient currently is not medically stable to d/c.   Difficult to place patient No  Zada Finders MD [facility]  If 7PM-7AM, please contact night-coverage [email or web address]  11/18/2020, 7:47 PM

## 2020-11-18 NOTE — ED Triage Notes (Signed)
Pt reports that he got a booster shot and took a Cipro pill about 1230. He just started having uncontrolled tremors and came to the ED to be evaluated.

## 2020-11-18 NOTE — ED Notes (Signed)
Pt transported to XR.  

## 2020-11-19 ENCOUNTER — Telehealth: Payer: Self-pay | Admitting: Family Medicine

## 2020-11-19 DIAGNOSIS — N3091 Cystitis, unspecified with hematuria: Secondary | ICD-10-CM

## 2020-11-19 DIAGNOSIS — A419 Sepsis, unspecified organism: Secondary | ICD-10-CM

## 2020-11-19 DIAGNOSIS — N390 Urinary tract infection, site not specified: Secondary | ICD-10-CM | POA: Diagnosis not present

## 2020-11-19 LAB — COMPREHENSIVE METABOLIC PANEL
ALT: 37 U/L (ref 0–44)
AST: 24 U/L (ref 15–41)
Albumin: 4.1 g/dL (ref 3.5–5.0)
Alkaline Phosphatase: 34 U/L — ABNORMAL LOW (ref 38–126)
Anion gap: 6 (ref 5–15)
BUN: 14 mg/dL (ref 8–23)
CO2: 27 mmol/L (ref 22–32)
Calcium: 8.6 mg/dL — ABNORMAL LOW (ref 8.9–10.3)
Chloride: 104 mmol/L (ref 98–111)
Creatinine, Ser: 0.76 mg/dL (ref 0.61–1.24)
GFR, Estimated: >60 mL/min (ref >60)
Glucose, Bld: 116 mg/dL — ABNORMAL HIGH (ref 70–99)
Potassium: 4 mmol/L (ref 3.5–5.1)
Sodium: 137 mmol/L (ref 135–145)
Total Bilirubin: 1.5 mg/dL — ABNORMAL HIGH (ref 0.3–1.2)
Total Protein: 6.4 g/dL — ABNORMAL LOW (ref 6.5–8.1)

## 2020-11-19 LAB — CBC
HCT: 40 % (ref 39.0–52.0)
Hemoglobin: 13.3 g/dL (ref 13.0–17.0)
MCH: 29.6 pg (ref 26.0–34.0)
MCHC: 33.3 g/dL (ref 30.0–36.0)
MCV: 88.9 fL (ref 80.0–100.0)
Platelets: 141 10*3/uL — ABNORMAL LOW (ref 150–400)
RBC: 4.5 MIL/uL (ref 4.22–5.81)
RDW: 13.6 % (ref 11.5–15.5)
WBC: 6.7 10*3/uL (ref 4.0–10.5)
nRBC: 0 % (ref 0.0–0.2)

## 2020-11-19 LAB — HIV ANTIBODY (ROUTINE TESTING W REFLEX): HIV Screen 4th Generation wRfx: NONREACTIVE

## 2020-11-19 NOTE — ED Notes (Signed)
Patient is resting comfortably. 

## 2020-11-19 NOTE — Telephone Encounter (Signed)
Noted ER visit overnight for hemorrhagic cystitis. Will need f/u - rec f/u with urology, but if not planning to see uro rec f/u with me, may schedule in ~2 wks assuming symptoms are improving.

## 2020-11-19 NOTE — Telephone Encounter (Signed)
Spoke with pt about f/u from ER visit.  States he doesn't have a urologist.  Scheduled ER f/u with Dr. Darnell Level on 12/03/20 at 7:30.

## 2020-11-19 NOTE — Discharge Summary (Signed)
Jordan Reynolds [id number] DOB: 1955-09-10 DOA: 11/18/2020  PCP: Ria Bush, MD  Admit date: 11/18/2020 Discharge date: 11/19/2020  Time spent: 35 minutes  Recommendations for Outpatient Follow-up:  Urology f/u if hematuria persists or cystitis becomes recurrent  For pcp: consider dedicated renal imaging to f/u lesions incompletely visualized on CT    Discharge Diagnoses:  Principal Problem:   Complicated UTI (urinary tract infection) Active Problems:   Hepatic steatosis   Discharge Condition: stable  Diet recommendation: heart healthy  Filed Weights   11/18/20 1536  Weight: (!) 151.5 kg    History of present illness:  From admission hpi: Jordan Reynolds is a 65 y.o. male with medical history significant for morbid obesity who presented to the ED for evaluation of dysuria and rigors.   Patient reports new onset of dysuria and increased urinary frequency beginning 3 days ago.  Over the last day he developed feelings of incomplete bladder emptying.  He went to the walk-in clinic earlier today (11/18/2020).  He was diagnosed with a UTI (urinalysis showed negative nitrites, moderate leukocytes, >50 WBCs, >50 RBCs, rare bacteria).  He was prescribed a course of ciprofloxacin.  Before leaving he was also given a COVID-19 booster shot.  Patient states that he took the first dose of ciprofloxacin.  When he urinated afterwards he noticed blood clots in his urine which he had not seen earlier.  About an hour later he developed rigors and diaphoresis.  He subsequently presented to the ED for further evaluation.  Patient otherwise denies any chest pain, dyspnea, nausea, vomiting, abdominal pain.  He states that he does not take any medications regularly other than multivitamin.  He is a former smoker of 25 years and quit in 1998.  Hospital Course:  Patient presented with acute hemorrhagic cystitis with sepsis. Sepsis by fever, elevated lactate. Cipro ordered as outpt but pt had not  started. Hematuria clearing. Sepsis physiology resolved. Patient feeling well. CT w/o signs stone or pyelo. Urine culture pending. First ever UTI. Treated here with fluids and 1 dose ceftriaxone. Plan will be discharge to complete 7-day course of ciprofloxacin. Will f/u culture and notify patient if need to change abx. B/l renal lesions incompletely visualized on CT, consider outpt dedicated renal imaging.  Procedures: none   Consultations: none  Discharge Exam: Vitals:   11/19/20 0630 11/19/20 0700  BP: (!) 138/92 127/86  Pulse: 73 72  Resp: 18   Temp:  97.6 F (36.4 C)  SpO2: 95% 96%    General: NAD Cardiovascular: RRR, Respiratory: CTAB Abdomen: obese, soft, non-tender  Discharge Instructions   Discharge Instructions     Diet - low sodium heart healthy   Complete by: As directed    Increase activity slowly   Complete by: As directed       Allergies as of 11/19/2020   No Known Allergies      Medication List     TAKE these medications    albuterol 108 (90 Base) MCG/ACT inhaler Commonly known as: VENTOLIN HFA Inhale 2 puffs into the lungs every 6 (six) hours as needed for wheezing or shortness of breath.   ciprofloxacin 500 MG tablet Commonly known as: CIPRO Take 500 mg by mouth 2 (two) times daily.   montelukast 10 MG tablet Commonly known as: SINGULAIR   MSM Powd Take by mouth. Takes 10,000 mg daily   multivitamin tablet Take 1 tablet by mouth daily.   VITAMIN C PO Take by mouth. Takes 5,000 mg powder (1 teaspoon)  No Known Allergies  Follow-up Information     Your PCP Follow up.   Why: As needed                 The results of significant diagnostics from this hospitalization (including imaging, microbiology, ancillary and laboratory) are listed below for reference.    Significant Diagnostic Studies: CT ABDOMEN PELVIS WO CONTRAST  Result Date: 11/18/2020 CLINICAL DATA:  Hematuria, unknown cause EXAM: CT ABDOMEN AND PELVIS  WITHOUT CONTRAST TECHNIQUE: Multidetector CT imaging of the abdomen and pelvis was performed following the standard protocol without IV contrast. COMPARISON:  CT abdomen pelvis 04/05/2017 FINDINGS: Lower chest: Bibasilar hypoventilatory changes. Hepatobiliary: Hepatic steatosis. No gallstones, gallbladder wall thickening, or biliary dilatation. Pancreas: Unremarkable. No pancreatic ductal dilatation or surrounding inflammatory changes. Spleen: Normal in size without focal abnormality. Adrenals/Urinary Tract: Adrenal glands are unremarkable. No hydronephrosis or nephrolithiasis. Mild contour nodularity of the left mid kidney posteriorly as well as a hypodense area just superiorly, and another at the superior pole the right kidney which could represent small renal cysts. Stomach/Bowel: The stomach is unremarkable. There is no evidence of bowel obstruction. The appendix is normal. There is scattered colonic diverticuli. No bowel wall thickening or significant inflammatory changes. Vascular/Lymphatic: Aortoiliac atherosclerotic calcifications. No AAA. No lymphadenopathy. Reproductive: Unremarkable. Other: Small fat containing left inguinal hernia.  No ascites. Musculoskeletal: No acute osseous abnormality. No suspicious lytic or blastic lesions. Multilevel degenerative changes of the spine. Bilateral hip degenerative changes. IMPRESSION: Mild bladder wall thickening with adjacent stranding, correlate with urinalysis for cystitis. No hydronephrosis or nephrolithiasis. Possible small bilateral renal lesions, incompletely evaluated on noncontrast CT, which could be small cysts. This should be further evaluated with nonemergent, multiphase contrast enhanced CT (CT urogram) as an outpatient given the history of hematuria. Hepatic steatosis. Electronically Signed   By: Maurine Simmering M.D.   On: 11/18/2020 17:25   DG Chest 2 View  Result Date: 11/18/2020 CLINICAL DATA:  Your.  Has UTI.  Weakness. EXAM: CHEST - 2 VIEW  COMPARISON:  Chest x-ray 10/13/2014. FINDINGS: The heart and mediastinal contours are unchanged. No focal consolidation. No pulmonary edema. No pleural effusion. No pneumothorax. No acute osseous abnormality. IMPRESSION: No active cardiopulmonary disease. Electronically Signed   By: Iven Finn M.D.   On: 11/18/2020 16:27    Microbiology: Recent Results (from the past 240 hour(s))  Resp Panel by RT-PCR (Flu A&B, Covid) Nasopharyngeal Swab     Status: None   Collection Time: 11/18/20  4:49 PM   Specimen: Nasopharyngeal Swab; Nasopharyngeal(NP) swabs in vial transport medium  Result Value Ref Range Status   SARS Coronavirus 2 by RT PCR NEGATIVE NEGATIVE Final    Comment: (NOTE) SARS-CoV-2 target nucleic acids are NOT DETECTED.  The SARS-CoV-2 RNA is generally detectable in upper respiratory specimens during the acute phase of infection. The lowest concentration of SARS-CoV-2 viral copies this assay can detect is 138 copies/mL. A negative result does not preclude SARS-Cov-2 infection and should not be used as the sole basis for treatment or other patient management decisions. A negative result may occur with  improper specimen collection/handling, submission of specimen other than nasopharyngeal swab, presence of viral mutation(s) within the areas targeted by this assay, and inadequate number of viral copies(<138 copies/mL). A negative result must be combined with clinical observations, patient history, and epidemiological information. The expected result is Negative.  Fact Sheet for Patients:  EntrepreneurPulse.com.au  Fact Sheet for Healthcare Providers:  IncredibleEmployment.be  This test is no t yet  approved or cleared by the [place] and  has been authorized for detection and/or diagnosis of SARS-CoV-2 by FDA under an Emergency Use Authorization (EUA). This EUA will remain  in effect (meaning this test can be used) for the duration  of the COVID-19 declaration under Section 564(b)(1) of the Act, 21 U.S.C.section 360bbb-3(b)(1), unless the authorization is terminated  or revoked sooner.       Influenza A by PCR NEGATIVE NEGATIVE Final   Influenza B by PCR NEGATIVE NEGATIVE Final    Comment: (NOTE) The Xpert Xpress SARS-CoV-2/FLU/RSV plus assay is intended as an aid in the diagnosis of influenza from Nasopharyngeal swab specimens and should not be used as a sole basis for treatment. Nasal washings and aspirates are unacceptable for Xpert Xpress SARS-CoV-2/FLU/RSV testing.  Fact Sheet for Patients: EntrepreneurPulse.com.au  Fact Sheet for Healthcare Providers: IncredibleEmployment.be  This test is not yet approved or cleared by the [place] FDA and has been authorized for detection and/or diagnosis of SARS-CoV-2 by FDA under an Emergency Use Authorization (EUA). This EUA will remain in effect (meaning this test can be used) for the duration of the COVID-19 declaration under Section 564(b)(1) of the Act, 21 U.S.C. section 360bbb-3(b)(1), unless the authorization is terminated or revoked.  Performed at [facility], [address] [place], [place] [address]   CULTURE, BLOOD (ROUTINE X 2) w Reflex to ID Panel     Status: None (Preliminary result)   Collection Time: 11/18/20  7:30 PM   Specimen: BLOOD  Result Value Ref Range Status   Specimen Description BLOOD BLOOD RIGHT HAND  Final   Special Requests   Final    BOTTLES DRAWN AEROBIC AND ANAEROBIC Blood Culture adequate volume   Culture   Final    NO GROWTH < 12 HOURS Performed at [facility], [address], [place], [place] [address]    Report Status PENDING  Incomplete  CULTURE, BLOOD (ROUTINE X 2) w Reflex to ID Panel     Status: None (Preliminary result)   Collection Time: 11/18/20  7:30 PM   Specimen: BLOOD  Result Value Ref Range Status   Specimen Description BLOOD LEFT ANTECUBITAL   Final   Special Requests   Final    BOTTLES DRAWN AEROBIC AND ANAEROBIC Blood Culture adequate volume   Culture   Final    NO GROWTH < 12 HOURS Performed at [facility], [address] [place], [place] [address]    Report Status PENDING  Incomplete     Labs: Basic Metabolic Panel: Recent Labs  Lab 11/18/20 1600 11/19/20 0621  NA 136 137  K 4.1 4.0  CL 99 104  CO2 25 27  GLUCOSE 108* 116*  BUN 18 14  CREATININE 1.00 0.76  CALCIUM 9.1 8.6*   Liver Function Tests: Recent Labs  Lab 11/18/20 1600 11/19/20 0621  AST 34 24  ALT 45* 37  ALKPHOS 44 34*  BILITOT 1.7* 1.5*  PROT 7.7 6.4*  ALBUMIN 4.9 4.1   No results for input(s): LIPASE, AMYLASE in the last 168 hours. No results for input(s): AMMONIA in the last 168 hours. CBC: Recent Labs  Lab 11/18/20 1600 11/19/20 0621  WBC 6.6 6.7  NEUTROABS 5.2  --   HGB 15.2 13.3  HCT 45.4 40.0  MCV 89.9 88.9  PLT 148* 141*   Cardiac Enzymes: No results for input(s): CKTOTAL, CKMB, CKMBINDEX, TROPONINI in the last 168 hours. BNP: BNP (last 3 results) No results for input(s): BNP  in the last 8760 hours.  ProBNP (last 3 results) No results for input(s): PROBNP in the last 8760 hours.  CBG: No results for input(s): GLUCAP in the last 168 hours.     Signed:  Desma Maxim MD.  [facility] 11/19/2020, 8:44 AM

## 2020-11-20 LAB — URINE CULTURE

## 2020-11-23 LAB — CULTURE, BLOOD (ROUTINE X 2)
Culture: NO GROWTH
Culture: NO GROWTH
Special Requests: ADEQUATE
Special Requests: ADEQUATE

## 2020-12-03 ENCOUNTER — Ambulatory Visit: Payer: Commercial Managed Care - PPO | Admitting: Family Medicine

## 2020-12-03 ENCOUNTER — Telehealth: Payer: Self-pay | Admitting: Gastroenterology

## 2020-12-03 NOTE — Telephone Encounter (Signed)
The pt states that his insurance is not covering his colonoscopy because he has polyps.  He asked if the code could be changed to screening.  I did advise him that we can not change the codes because he indeed does have polyps.  He states he will call his insurance again and see what he can do in the future.

## 2020-12-03 NOTE — Telephone Encounter (Signed)
Patient called he is requesting a call back from nurse regarding a colonoscopy bill and how it was coded.

## 2021-01-14 DIAGNOSIS — Z23 Encounter for immunization: Secondary | ICD-10-CM | POA: Diagnosis not present

## 2021-02-08 ENCOUNTER — Ambulatory Visit: Payer: Commercial Managed Care - PPO | Admitting: Family Medicine

## 2021-02-08 ENCOUNTER — Other Ambulatory Visit: Payer: Self-pay

## 2021-02-08 ENCOUNTER — Encounter: Payer: Self-pay | Admitting: Family Medicine

## 2021-02-08 VITALS — BP 152/100 | HR 76 | Temp 97.5°F | Ht 73.0 in | Wt 333.6 lb

## 2021-02-08 DIAGNOSIS — I10 Essential (primary) hypertension: Secondary | ICD-10-CM

## 2021-02-08 DIAGNOSIS — N2889 Other specified disorders of kidney and ureter: Secondary | ICD-10-CM

## 2021-02-08 DIAGNOSIS — K760 Fatty (change of) liver, not elsewhere classified: Secondary | ICD-10-CM

## 2021-02-08 DIAGNOSIS — N3091 Cystitis, unspecified with hematuria: Secondary | ICD-10-CM

## 2021-02-08 DIAGNOSIS — I700 Atherosclerosis of aorta: Secondary | ICD-10-CM | POA: Insufficient documentation

## 2021-02-08 DIAGNOSIS — N281 Cyst of kidney, acquired: Secondary | ICD-10-CM

## 2021-02-08 LAB — POC URINALSYSI DIPSTICK (AUTOMATED)
Bilirubin, UA: NEGATIVE
Blood, UA: NEGATIVE
Glucose, UA: NEGATIVE
Ketones, UA: NEGATIVE
Leukocytes, UA: NEGATIVE
Nitrite, UA: NEGATIVE
Protein, UA: NEGATIVE
Spec Grav, UA: 1.015 (ref 1.010–1.025)
Urobilinogen, UA: 0.2 E.U./dL
pH, UA: 7 (ref 5.0–8.0)

## 2021-02-08 LAB — COMPREHENSIVE METABOLIC PANEL
ALT: 46 U/L (ref 0–53)
AST: 32 U/L (ref 0–37)
Albumin: 4.6 g/dL (ref 3.5–5.2)
Alkaline Phosphatase: 38 U/L — ABNORMAL LOW (ref 39–117)
BUN: 18 mg/dL (ref 6–23)
CO2: 29 mEq/L (ref 19–32)
Calcium: 9.5 mg/dL (ref 8.4–10.5)
Chloride: 103 mEq/L (ref 96–112)
Creatinine, Ser: 0.94 mg/dL (ref 0.40–1.50)
GFR: 86.19 mL/min (ref >60.00)
Glucose, Bld: 109 mg/dL — ABNORMAL HIGH (ref 70–99)
Potassium: 4.7 mEq/L (ref 3.5–5.1)
Sodium: 139 mEq/L (ref 135–145)
Total Bilirubin: 1 mg/dL (ref 0.2–1.2)
Total Protein: 6.8 g/dL (ref 6.0–8.3)

## 2021-02-08 LAB — LIPID PANEL
Cholesterol: 208 mg/dL — ABNORMAL HIGH (ref 0–200)
HDL: 55.6 mg/dL (ref >39.00)
LDL Cholesterol: 135 mg/dL — ABNORMAL HIGH (ref 0–99)
NonHDL: 152.55
Total CHOL/HDL Ratio: 4
Triglycerides: 86 mg/dL (ref 0.0–149.0)
VLDL: 17.2 mg/dL (ref 0.0–40.0)

## 2021-02-08 LAB — TSH: TSH: 1.56 u[IU]/mL (ref 0.35–5.50)

## 2021-02-08 MED ORDER — AMLODIPINE BESYLATE 5 MG PO TABS: 5.0000 mg | ORAL_TABLET | Freq: Every day | ORAL | 3 refills | Status: DC

## 2021-02-08 NOTE — Assessment & Plan Note (Signed)
This has since resolved. UA normal today

## 2021-02-08 NOTE — Progress Notes (Signed)
Patient ID: Jordan Reynolds, male    DOB: 1955-12-02, 65 y.o.   MRN: [id number]  This visit was conducted in person.  BP (!) 152/100   Pulse 76   Temp (!) 97.5 F (36.4 C) (Temporal)   Ht 6\' 1"  (1.854 m)   Wt (!) 333 lb 9 oz (151.3 kg)   SpO2 96%   BMI 44.01 kg/m   BP Readings from Last 3 Encounters:  02/08/21 (!) 152/100  11/19/20 127/86  09/21/20 (!) 146/87  154/110 on retesting  CC: discuss CT scan, elevated blood pressures  Subjective:   HPI: Jordan Reynolds is a 65 y.o. male presenting on 02/08/2021 for Results (Wants to discuss something abnormal on CT results. ), Elevated Blood Pressure (Elevated BP readings at [facility].  Told to see PCP.), and Insomnia (C/o not staying asleep.  Also, wakes several times nightly to urinate. )   Recent elevated BP readings at [facility] physical up to 650P systolic.  No HA, vision changes, CP/tightness, SOB, leg swelling.   Recent 24 hour hospitalization 11/2020 for hemorrhagic cystitis treated with rocephin x1 and cipro 7d course.  CT scan at that time also showed hepatic steatosis, possible small renal cysts, colonic diverticulosis, and aortic atherosclerotic calcifications.   Non smoker   He's started working for [employer] [phone]).      Relevant past medical, surgical, family and social history reviewed and updated as indicated. Interim medical history since our last visit reviewed. Allergies and medications reviewed and updated. Outpatient Medications Prior to Visit  Medication Sig Dispense Refill   albuterol (VENTOLIN HFA) 108 (90 Base) MCG/ACT inhaler Inhale 2 puffs into the lungs every 6 (six) hours as needed for wheezing or shortness of breath.     Ascorbic Acid (VITAMIN C PO) Take by mouth. Takes 5,000 mg powder (1 teaspoon) (Patient not taking: Reported on 11/18/2020)     ciprofloxacin (CIPRO) 500 MG tablet Take 500 mg by mouth 2 (two) times daily.     Methylsulfonylmethane (MSM) POWD Take by mouth. Takes  10,000 mg daily (Patient not taking: Reported on 11/18/2020)     montelukast (SINGULAIR) 10 MG tablet      Multiple Vitamin (MULTIVITAMIN) tablet Take 1 tablet by mouth daily.     No facility-administered medications prior to visit.     Per HPI unless specifically indicated in ROS section below Review of Systems  Objective:  BP (!) 152/100   Pulse 76   Temp (!) 97.5 F (36.4 C) (Temporal)   Ht 6\' 1"  (1.854 m)   Wt (!) 333 lb 9 oz (151.3 kg)   SpO2 96%   BMI 44.01 kg/m   Wt Readings from Last 3 Encounters:  02/08/21 (!) 333 lb 9 oz (151.3 kg)  11/18/20 (!) 334 lb (151.5 kg)  09/21/20 (!) 341 lb (154.7 kg)      Physical Exam Vitals and nursing note reviewed.  Constitutional:      Appearance: Normal appearance. He is obese. He is not ill-appearing.  Cardiovascular:     Rate and Rhythm: Normal rate and regular rhythm.     Pulses: Normal pulses.     Heart sounds: Normal heart sounds. No murmur heard. Pulmonary:     Effort: Pulmonary effort is normal. No respiratory distress.     Breath sounds: Normal breath sounds. No wheezing, rhonchi or rales.  Musculoskeletal:     Right lower leg: No edema.     Left lower leg: No edema.  Skin:    General: Skin is warm and dry.  Neurological:     Mental Status: He is alert.  Psychiatric:        Mood and Affect: Mood normal.        Behavior: Behavior normal.       Assessment & Plan:  This visit occurred during the SARS-CoV-2 public health emergency.  Safety protocols were in place, including screening questions prior to the visit, additional usage of staff PPE, and extensive cleaning of exam room while observing appropriate contact time as indicated for disinfecting solutions.   Problem List Items Addressed This Visit     Hepatic steatosis    Discussed with patient.       Obesity, morbid, BMI 40.0-49.9 ([place])    Encouraged renewed efforts for sustainable weight loss.       Hemorrhagic cystitis    This has since resolved. UA  normal today      Primary hypertension - Primary    Elevated BP readings. Discussed causes of hypertension and reasons to treat.  Start amlodipine 5mg  daily. Encouraged weight loss, limiting salt/sodium in diet, increase in potassium rich foods.       Relevant Medications   amLODipine (NORVASC) 5 MG tablet   Other Relevant Orders   Lipid panel   Comprehensive metabolic panel   TSH   Acquired cyst of kidney    Will order renal protocol CT abdomen with/without contrast to further evaluate possible kidney cysts seen on recent CT.  UA today reassuringly without hematuria      Relevant Orders   CT RENAL ABD W/WO   Atherosclerosis of aorta (HCC)   Relevant Medications   amLODipine (NORVASC) 5 MG tablet     Meds ordered this encounter  Medications   amLODipine (NORVASC) 5 MG tablet    Sig: Take 1 tablet (5 mg total) by mouth daily.    Dispense:  90 tablet    Refill:  3   Orders Placed This Encounter  Procedures   CT RENAL ABD W/WO    Standing Status:   Future    Standing Expiration Date:   02/08/2022    Order Specific Question:   If indicated for the ordered procedure, I authorize the administration of contrast media per Radiology protocol    Answer:   Yes    Order Specific Question:   Preferred imaging location?    Answer:   [facility]   Lipid panel   Comprehensive metabolic panel   TSH     Patient Instructions  Your goal blood pressure is <140/90. Start amlodipine 5mg  daily for blood pressure - watch for ankle swelling on this medicine.  Start monitoring blood pressures at home with automatic BP arm cuff.  Work on low salt/sodium diet - goal <1.5gm (1,500mg ) per day. Eat a diet high in fruits/vegetables and whole grains.  Look into mediterranean and DASH diet. Goal activity is 131min/wk of moderate intensity exercise.  This can be split into 30 minute chunks.  If you are not at this level, you can start with smaller 10-15 min increments and slowly build up  activity. Look at [place].org for more resources  We will order kidney CT scan to better evaluate possible kidney cysts.  Keep me updated on BP readings, return in 3 months for BP follow up visit.   Follow up plan: Return in about 3 months (around 05/11/2021) for follow up visit.  Ria Bush, MD

## 2021-02-08 NOTE — Patient Instructions (Addendum)
Your goal blood pressure is <140/90. Start amlodipine 5mg  daily for blood pressure - watch for ankle swelling on this medicine.  Start monitoring blood pressures at home with automatic BP arm cuff.  Work on low salt/sodium diet - goal <1.5gm (1,500mg ) per day. Eat a diet high in fruits/vegetables and whole grains.  Look into mediterranean and DASH diet. Goal activity is 150min/wk of moderate intensity exercise.  This can be split into 30 minute chunks.  If you are not at this level, you can start with smaller 10-15 min increments and slowly build up activity. Look at [place].org for more resources  We will order kidney CT scan to better evaluate possible kidney cysts.  Keep me updated on BP readings, return in 3 months for BP follow up visit.

## 2021-02-08 NOTE — Assessment & Plan Note (Signed)
Elevated BP readings. Discussed causes of hypertension and reasons to treat.  Start amlodipine 5mg  daily. Encouraged weight loss, limiting salt/sodium in diet, increase in potassium rich foods.

## 2021-02-08 NOTE — Assessment & Plan Note (Signed)
Encouraged renewed efforts for sustainable weight loss.

## 2021-02-08 NOTE — Assessment & Plan Note (Signed)
Discussed with patient

## 2021-02-08 NOTE — Assessment & Plan Note (Signed)
Will order renal protocol CT abdomen with/without contrast to further evaluate possible kidney cysts seen on recent CT.  UA today reassuringly without hematuria

## 2021-02-10 ENCOUNTER — Telehealth: Payer: Self-pay | Admitting: Family Medicine

## 2021-02-10 NOTE — Telephone Encounter (Signed)
Pt called stating that his BP medication amLODipine (NORVASC) 5 MG tablet, may cause drowsiness. 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 Pt states that he cant drive until he gets the letter. Pt asking if it can be emailed to him at [email or web address].

## 2021-02-11 NOTE — Telephone Encounter (Signed)
Lvm asking pt to call back. Need to inform pt we're not able to email the letter.  However, we can send it to him through his MyChart to print, we can mail it to him or he can pick it up from [place] office.   Just let us know what he wants [place] to do.   [Printed letter in [employer] on [employer].]

## 2021-02-11 NOTE — Telephone Encounter (Signed)
Pt states that he would like it sent to his my chart and mailed to him in case he cant print it.

## 2021-02-11 NOTE — Addendum Note (Signed)
Addended by: Ria Bush on: 02/11/2021 09:17 PM   Modules accepted: Orders

## 2021-02-11 NOTE — Telephone Encounter (Addendum)
Noted.  Sent letter via MyChart and mailed letter to pt.

## 2021-02-11 NOTE — Telephone Encounter (Signed)
Letter written and in Lisa's box.  However, to let us know if he develops drowsiness on medication - if that happens, would then suggest he take in PM.

## 2021-02-17 ENCOUNTER — Telehealth: Payer: Self-pay | Admitting: Family Medicine

## 2021-02-17 DIAGNOSIS — I10 Essential (primary) hypertension: Secondary | ICD-10-CM

## 2021-02-17 MED ORDER — BENAZEPRIL HCL 10 MG PO TABS: 10.0000 mg | ORAL_TABLET | Freq: Every day | ORAL | 3 refills | Status: DC

## 2021-02-17 NOTE — Telephone Encounter (Signed)
Recommend we add second BP med benazepril 10mg  daily sent to pharmacy.  Recommend schedule lab visit in 1-2 wks to recheck kidneys on new medicine. Take with amlodipine, if doing well we can mix both into 1 combo pill daily.  Update [place] with effect in 2 wks.

## 2021-02-17 NOTE — Telephone Encounter (Signed)
Pt called stating that his blood pressure isn't getting any better 164/118. Pt is asking should he stay on medication amLODipine (NORVASC) 5 MG tablet,and up the dose or should he change the medication. 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

## 2021-02-17 NOTE — Addendum Note (Signed)
Addended by: Ria Bush on: 02/17/2021 10:59 PM   Modules accepted: Orders

## 2021-02-18 NOTE — Telephone Encounter (Signed)
Lvm asking pt to call back.  Need to relay Dr. Synthia Innocent message and schedule 1-2 wk lab visit.

## 2021-02-21 NOTE — Telephone Encounter (Signed)
Lvm asking pt to call back.  Need to relay Dr. Synthia Innocent message and schedule 1-2 wk lab visit.

## 2021-02-22 ENCOUNTER — Encounter: Payer: Self-pay | Admitting: *Deleted

## 2021-02-22 NOTE — Telephone Encounter (Signed)
Lvm asking pt to call back.  Need to relay Dr. Synthia Innocent message and schedule 1-2 wk lab visit.

## 2021-02-23 NOTE — Telephone Encounter (Signed)
Pt rtn call.  I relayed Dr. Synthia Innocent message.  Pt verbalizes understanding and schedule lab visit [@ BS] on 03/03/21 at 2:45.

## 2021-02-23 NOTE — Telephone Encounter (Signed)
Lvm asking pt to call back.  Need to relay Dr. Synthia Innocent message and schedule 1-2 wk lab visit.  Mailing a letter.

## 2021-03-03 ENCOUNTER — Other Ambulatory Visit: Payer: Self-pay

## 2021-03-03 ENCOUNTER — Other Ambulatory Visit (INDEPENDENT_AMBULATORY_CARE_PROVIDER_SITE_OTHER): Payer: Commercial Managed Care - PPO

## 2021-03-03 DIAGNOSIS — I10 Essential (primary) hypertension: Secondary | ICD-10-CM | POA: Diagnosis not present

## 2021-03-03 LAB — BASIC METABOLIC PANEL
BUN: 17 mg/dL (ref 6–23)
CO2: 28 mEq/L (ref 19–32)
Calcium: 9.6 mg/dL (ref 8.4–10.5)
Chloride: 102 mEq/L (ref 96–112)
Creatinine, Ser: 0.96 mg/dL (ref 0.40–1.50)
GFR: 84 mL/min (ref >60.00)
Glucose, Bld: 106 mg/dL — ABNORMAL HIGH (ref 70–99)
Potassium: 4 mEq/L (ref 3.5–5.1)
Sodium: 136 mEq/L (ref 135–145)

## 2021-03-03 LAB — MICROALBUMIN / CREATININE URINE RATIO
Creatinine,U: 57.9 mg/dL
Microalb Creat Ratio: 1.2 mg/g (ref 0.0–30.0)
Microalb, Ur: <0.7 mg/dL (ref 0.0–1.9)

## 2021-03-07 ENCOUNTER — Encounter: Payer: Self-pay | Admitting: Family Medicine

## 2021-03-10 NOTE — Telephone Encounter (Signed)
Lvm asking pt to call back.  Need to relay Dr. Synthia Innocent message and schedule 2 wk OV for BP f/u.

## 2021-03-10 NOTE — Telephone Encounter (Signed)
Plz call - Blood pressures are staying too high - is this on BOTH amlodipine 5mg  daily and benazepril 10mg  daily? If so, increase benazepril to 20mg  daily, continue amlodipine 5mg  daily. Update me with readings in another 1-2 weeks.  I've sent in 20mg  benazepril dose to pharmacy.   Would offer OV in 2 wks to recheck BP.

## 2021-03-11 NOTE — Telephone Encounter (Signed)
Spoke with pt scheduling 2 wk HTN f/u on 03/23/21 at 9:30.  Pt states saw Dr. Synthia Innocent MyChart message and verbalizes understanding.  Confirms BP was elevated on both amlodipine 5 mg and benazepril 10 mg daily.  So he increased benazepril to 20 mg with amlodipine 5 mg daily.

## 2021-03-15 NOTE — Telephone Encounter (Addendum)
Looks like message was forwarded to wrong person.  Spoke with pt scheduling OV on 03/16/21 at 4:30. Will have front office add to schedule.

## 2021-03-15 NOTE — Telephone Encounter (Signed)
Pt returned call would like a call back [phone]

## 2021-03-15 NOTE — Telephone Encounter (Addendum)
Spoke with pt asking if he's checked BP since this morning.  Says he has not and that BP was about 11:00 before taking his med.  HR is staying 87-88.  Asked pt to recheck BP but he is currently at work and not able to.  Denies any HA, chest pain or SOB.  Tried to schedule OV but pt states he will have to call back.

## 2021-03-15 NOTE — Telephone Encounter (Signed)
Please triage patient - any symptoms? Has he rechecked BP since this morning? Does he think this morning's BP was accurate?  Please schedule in office appt this week.

## 2021-03-15 NOTE — Addendum Note (Signed)
Addended by: Ria Bush on: 03/15/2021 09:12 AM   Modules accepted: Orders

## 2021-03-16 ENCOUNTER — Ambulatory Visit
Admission: RE | Admit: 2021-03-16 | Discharge: 2021-03-16 | Disposition: A | Discharge status: Home / Self Care | Payer: Commercial Managed Care - PPO | Source: Ambulatory Visit | Attending: Family Medicine | Admitting: Family Medicine

## 2021-03-16 ENCOUNTER — Encounter: Payer: Self-pay | Admitting: Family Medicine

## 2021-03-16 ENCOUNTER — Other Ambulatory Visit: Payer: Self-pay

## 2021-03-16 ENCOUNTER — Ambulatory Visit (INDEPENDENT_AMBULATORY_CARE_PROVIDER_SITE_OTHER): Payer: Commercial Managed Care - PPO | Admitting: Family Medicine

## 2021-03-16 DIAGNOSIS — I10 Essential (primary) hypertension: Secondary | ICD-10-CM

## 2021-03-16 DIAGNOSIS — N281 Cyst of kidney, acquired: Secondary | ICD-10-CM | POA: Diagnosis not present

## 2021-03-16 MED ORDER — IOHEXOL 350 MG/ML SOLN
100.0000 mL | Freq: Once | INTRAVENOUS | Status: AC | PRN
  Administered 2021-03-16: 11:00:00 100 mL via INTRAVENOUS

## 2021-03-16 MED ORDER — AMLODIPINE BESY-BENAZEPRIL HCL 5-20 MG PO CAPS: 1.0000 | ORAL_CAPSULE | Freq: Every day | ORAL | 3 refills | Status: DC

## 2021-03-16 NOTE — Patient Instructions (Addendum)
Our office readings are running about 10 points lower than your home meter.  Ensure you're placing cuff correctly on arm.  Keep checking BP at home, let us know if running very high.

## 2021-03-16 NOTE — Assessment & Plan Note (Signed)
BP much better controlled in office today with our manual cuff reading 10-15 pts lower than his home automatic cuff. Also he was placing home cuff incorrectly. Discussed correct use of home cuff.  I'm happy with current BP readings based on our office reading today - continue amlodipine /benazepril, will send combo pill Lotrel 5/20mg  to take 1 tab daily.

## 2021-03-16 NOTE — Progress Notes (Signed)
Patient ID: Jordan Reynolds, male    DOB: 05/09/1955, 65 y.o.   MRN: [id number]  This visit was conducted in person.  BP 122/68    Pulse 80    Temp 97.6 F (36.4 C) (Temporal)    Ht 6\' 1"  (1.854 m)    Wt (!) 337 lb 7 oz (153.1 kg)    SpO2 95%    BMI 44.52 kg/m    CC: HTN  Subjective:   HPI: Jordan Reynolds is a 64 y.o. male presenting on 03/16/2021 for Hypertension (C/o still having elevated BP readings.  Pt brought in home BP monitor to compare.  Reading in office today- 156/103.)   See recent mychart messages and last office visit.  HTN - Compliant with current antihypertensive regimen of amlodipine 5mg  daily and benazepril 20mg  daily. Does check blood pressures at home: and brings log, also see recent mychart messages - widely fluctuating readings up to 200/100s, today's reading in office 30 points higher than our office reading. Denies low blood pressure readings or symptoms of dizziness/syncope. Denies HA, vision changes, CP/tightness, SOB, leg swelling.    Repeat with home BP cuff: R arm  137/95 Repeat with my stethoscope: R arm 124/80, L arm 130/80  Reviewing home BP cuff use, it seems he's not been placing cuff correctly which likely led to markedly elevated readings recently.      Relevant past medical, surgical, family and social history reviewed and updated as indicated. Interim medical history since our last visit reviewed. Allergies and medications reviewed and updated. Outpatient Medications Prior to Visit  Medication Sig Dispense Refill   montelukast (SINGULAIR) 10 MG tablet SMARTSIG:1 Tablet(s) By Mouth Every Evening     amLODipine (NORVASC) 5 MG tablet Take 1 tablet (5 mg total) by mouth daily. 90 tablet 3   benazepril (LOTENSIN) 20 MG tablet Take 1 tablet (20 mg total) by mouth daily. 30 tablet 3   No facility-administered medications prior to visit.     Per HPI unless specifically indicated in ROS section below Review of Systems  Objective:  BP 122/68     Pulse 80    Temp 97.6 F (36.4 C) (Temporal)    Ht 6\' 1"  (1.854 m)    Wt (!) 337 lb 7 oz (153.1 kg)    SpO2 95%    BMI 44.52 kg/m   Wt Readings from Last 3 Encounters:  03/16/21 (!) 337 lb 7 oz (153.1 kg)  02/08/21 (!) 333 lb 9 oz (151.3 kg)  11/18/20 (!) 334 lb (151.5 kg)      Physical Exam Vitals and nursing note reviewed.  Constitutional:      Appearance: Normal appearance. He is obese. He is not ill-appearing.  Neurological:     Mental Status: He is alert.  Psychiatric:        Mood and Affect: Mood normal.        Behavior: Behavior normal.      Results for orders placed or performed in visit on 03/03/21  Microalbumin / creatinine urine ratio  Result Value Ref Range   Microalb, Ur <0.7 0.0 - 1.9 mg/dL   Creatinine,U 57.9 mg/dL   Microalb Creat Ratio 1.2 0.0 - 30.0 mg/g  Basic metabolic panel  Result Value Ref Range   Sodium 136 135 - 145 mEq/L   Potassium 4.0 3.5 - 5.1 mEq/L   Chloride 102 96 - 112 mEq/L   CO2 28 19 - 32 mEq/L   Glucose, Bld 106 (H) 70 -  99 mg/dL   BUN 17 6 - 23 mg/dL   Creatinine, Ser 0.96 0.40 - 1.50 mg/dL   GFR 84.00 >60.00 mL/min   Calcium 9.6 8.4 - 10.5 mg/dL    Assessment & Plan:  This visit occurred during the SARS-CoV-2 public health emergency.  Safety protocols were in place, including screening questions prior to the visit, additional usage of staff PPE, and extensive cleaning of exam room while observing appropriate contact time as indicated for disinfecting solutions.   Problem List Items Addressed This Visit     Primary hypertension    BP much better controlled in office today with our manual cuff reading 10-15 pts lower than his home automatic cuff. Also he was placing home cuff incorrectly. Discussed correct use of home cuff.  I'm happy with current BP readings based on our office reading today - continue amlodipine /benazepril, will send combo pill Lotrel 5/20mg  to take 1 tab daily.       Relevant Medications    amLODipine-benazepril (LOTREL) 5-20 MG capsule     Meds ordered this encounter  Medications   amLODipine-benazepril (LOTREL) 5-20 MG capsule    Sig: Take 1 capsule by mouth daily.    Dispense:  90 capsule    Refill:  3    To replace individual components    No orders of the defined types were placed in this encounter.    Patient Instructions  Our office readings are running about 10 points lower than your home meter.  Ensure you're placing cuff correctly on arm.  Keep checking BP at home, let us know if running very high.   Follow up plan: No follow-ups on file.  Ria Bush, MD

## 2021-03-16 NOTE — Telephone Encounter (Signed)
Will discuss at office visit scheduled this afternoon.

## 2021-03-23 ENCOUNTER — Ambulatory Visit: Payer: Commercial Managed Care - PPO | Admitting: Family Medicine

## 2021-05-13 ENCOUNTER — Ambulatory Visit: Payer: Commercial Managed Care - PPO | Admitting: Family Medicine

## 2021-06-13 ENCOUNTER — Other Ambulatory Visit: Payer: Self-pay

## 2021-06-13 ENCOUNTER — Ambulatory Visit: Payer: Commercial Managed Care - PPO | Admitting: Family Medicine

## 2021-06-13 ENCOUNTER — Telehealth: Payer: Self-pay

## 2021-06-13 ENCOUNTER — Encounter: Payer: Self-pay | Admitting: Family Medicine

## 2021-06-13 VITALS — BP 138/88 | HR 78 | Temp 97.8°F | Ht 73.0 in | Wt 333.0 lb

## 2021-06-13 DIAGNOSIS — I10 Essential (primary) hypertension: Secondary | ICD-10-CM

## 2021-06-13 DIAGNOSIS — Z125 Encounter for screening for malignant neoplasm of prostate: Secondary | ICD-10-CM | POA: Diagnosis not present

## 2021-06-13 DIAGNOSIS — E7800 Pure hypercholesterolemia, unspecified: Secondary | ICD-10-CM

## 2021-06-13 DIAGNOSIS — K760 Fatty (change of) liver, not elsewhere classified: Secondary | ICD-10-CM

## 2021-06-13 DIAGNOSIS — Z0001 Encounter for general adult medical examination with abnormal findings: Secondary | ICD-10-CM | POA: Diagnosis not present

## 2021-06-13 DIAGNOSIS — R7303 Prediabetes: Secondary | ICD-10-CM | POA: Diagnosis not present

## 2021-06-13 DIAGNOSIS — I700 Atherosclerosis of aorta: Secondary | ICD-10-CM

## 2021-06-13 LAB — HEMOGLOBIN A1C: Hgb A1c MFr Bld: 6.1 % (ref 4.6–6.5)

## 2021-06-13 LAB — BASIC METABOLIC PANEL
BUN: 20 mg/dL (ref 6–23)
CO2: 26 mEq/L (ref 19–32)
Calcium: 9.7 mg/dL (ref 8.4–10.5)
Chloride: 101 mEq/L (ref 96–112)
Creatinine, Ser: 0.95 mg/dL (ref 0.40–1.50)
GFR: 84.9 mL/min (ref >60.00)
Glucose, Bld: 109 mg/dL — ABNORMAL HIGH (ref 70–99)
Potassium: 5.1 mEq/L (ref 3.5–5.1)
Sodium: 137 mEq/L (ref 135–145)

## 2021-06-13 LAB — PSA: PSA: 0.42 ng/mL (ref 0.10–4.00)

## 2021-06-13 MED ORDER — ATORVASTATIN CALCIUM 20 MG PO TABS: 20.0000 mg | ORAL_TABLET | Freq: Every day | ORAL | 3 refills | Status: DC

## 2021-06-13 NOTE — Assessment & Plan Note (Signed)
Start statin.  

## 2021-06-13 NOTE — Assessment & Plan Note (Signed)
Update A1c today 

## 2021-06-13 NOTE — Progress Notes (Signed)
Patient ID: MERCURY ROCK, male    DOB: [birth year], 66 y.o.   MRN: [id number]  This visit was conducted in person.  BP 138/88    Pulse 78    Temp 97.8 F (36.6 C) (Skin)    Ht '6\' 1"'$  (1.854 m)    Wt (!) 333 lb (151 kg)    SpO2 98%    BMI 43.93 kg/m    CC:  3 mo f/u visit  Subjective:   HPI: Jordan Reynolds is a 66 y.o. male presenting on 06/13/2021 for Follow-up   Hasn't ben checking BP at home.  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  Gets DOT physicals yearly.   Fsating today - drank bai water this morning.   Preventative: COLONOSCOPY 09/2020 - 8 polyps removed  (TAs), rpt 5 yrs Ardis Hughs) Prostate cancer screening - father with h/o prostate cancer - requests checked yearly.  Lung cancer screening - not eligible - quit >15 yrs ago  Flu - yearly  [place] 06/2019, 07/2019, has had boosters Td 2009, Tdap 10/2012 , Tdap 02/2019 Shingrix - 12/2018, 03/2019  Seat belt use discussed  Sunscreen use discussed. No changing moles. Has seen derm Ex smoker - quit 1998, 30+ PY hx.  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  Activity: no regular exercise  Diet: some water, some fruits/vegetables, 1 L diet sodas, limiting sodium      Relevant past medical, surgical, family and social history reviewed and updated as indicated. Interim medical history since our last visit reviewed. Allergies and medications reviewed and updated. Outpatient Medications Prior to Visit  Medication Sig Dispense Refill   amLODipine-benazepril (LOTREL) 5-20 MG capsule Take 1 capsule by mouth daily. 90 capsule 3   montelukast (SINGULAIR) 10 MG tablet SMARTSIG:1 Tablet(s) By Mouth Every Evening      No facility-administered medications prior to visit.     Per HPI unless specifically indicated in ROS section below Review of Systems  Constitutional:  Negative for activity change, appetite change, chills, fatigue, fever and unexpected weight change.  HENT:  Negative for hearing loss.   Eyes:  Negative for visual disturbance.  Respiratory:  Positive for cough. Negative for chest tightness, shortness of breath and wheezing.   Cardiovascular:  Negative for chest pain, palpitations and leg swelling.  Gastrointestinal:  Negative for abdominal distention, abdominal pain, blood in stool, constipation, diarrhea, nausea and vomiting.  Genitourinary:  Negative for difficulty urinating and hematuria.  Musculoskeletal:  Negative for arthralgias, myalgias and neck pain.  Skin:  Negative for rash.  Neurological:  Positive for headaches (attributes to cervicogenic HA). Negative for dizziness, seizures and syncope.  Hematological:  Negative for adenopathy. Does not bruise/bleed easily.  Psychiatric/Behavioral:  Negative for dysphoric mood. The patient is not nervous/anxious.    Objective:  BP 138/88    Pulse 78    Temp 97.8 F (36.6 C) (Skin)    Ht '6\' 1"'$  (1.854 m)    Wt (!) 333 lb (151 kg)    SpO2 98%    BMI 43.93 kg/m   Wt Readings from Last 3 Encounters:  06/13/21 (!) 333 lb (151 kg)  03/16/21 Marland Kitchen)  337 lb 7 oz (153.1 kg)  02/08/21 (!) 333 lb 9 oz (151.3 kg)      Physical Exam Vitals and nursing note reviewed.  Constitutional:      General: He is not in acute distress.    Appearance: Normal appearance. He is well-developed. He is not ill-appearing.  HENT:     Head: Normocephalic and atraumatic.     Right Ear: Hearing, tympanic membrane, ear canal and external ear normal.     Left Ear: Hearing, tympanic membrane, ear canal and external ear normal.  Eyes:     General: No scleral icterus.    Extraocular Movements: Extraocular movements intact.     Conjunctiva/sclera: Conjunctivae normal.      Pupils: Pupils are equal, round, and reactive to light.  Neck:     Thyroid: No thyroid mass or thyromegaly.     Vascular: No carotid bruit.  Cardiovascular:     Rate and Rhythm: Normal rate and regular rhythm.     Pulses: Normal pulses.          Radial pulses are 2+ on the right side and 2+ on the left side.     Heart sounds: Normal heart sounds. No murmur heard. Pulmonary:     Effort: Pulmonary effort is normal. No respiratory distress.     Breath sounds: Normal breath sounds. No wheezing, rhonchi or rales.  Abdominal:     General: Bowel sounds are normal. There is no distension.     Palpations: Abdomen is soft. There is no mass.     Tenderness: There is no abdominal tenderness. There is no guarding or rebound.     Hernia: No hernia is present.  Musculoskeletal:        General: Normal range of motion.     Cervical back: Normal range of motion and neck supple.     Right lower leg: No edema.     Left lower leg: No edema.  Lymphadenopathy:     Cervical: No cervical adenopathy.  Skin:    General: Skin is warm and dry.     Findings: No rash.  Neurological:     General: No focal deficit present.     Mental Status: He is alert and oriented to person, place, and time.  Psychiatric:        Mood and Affect: Mood normal.        Behavior: Behavior normal.        Thought Content: Thought content normal.        Judgment: Judgment normal.      Results for orders placed or performed in visit on 03/03/21  Microalbumin / creatinine urine ratio  Result Value Ref Range   Microalb, Ur <0.7 0.0 - 1.9 mg/dL   Creatinine,U 57.9 mg/dL   Microalb Creat Ratio 1.2 0.0 - 30.0 mg/g  Basic metabolic panel  Result Value Ref Range   Sodium 136 135 - 145 mEq/L   Potassium 4.0 3.5 - 5.1 mEq/L   Chloride 102 96 - 112 mEq/L   CO2 28 19 - 32 mEq/L   Glucose, Bld 106 (H) 70 - 99 mg/dL   BUN 17 6 - 23 mg/dL   Creatinine, Ser 0.96 0.40 - 1.50 mg/dL   GFR 84.00 >60.00 mL/min   Calcium 9.6 8.4 - 10.5  mg/dL    Assessment & Plan:  This visit occurred during the SARS-CoV-2 public health emergency.  Safety protocols were in place, including screening questions prior to the visit, additional usage of staff  PPE, and extensive cleaning of exam room while observing appropriate contact time as indicated for disinfecting solutions.   Problem List Items Addressed This Visit     Encounter for well adult exam with abnormal findings - Primary (Chronic)    Preventative protocols reviewed and updated unless pt declined. Discussed healthy diet and lifestyle.       Prediabetes    Update A1c today       Relevant Orders   Hemoglobin K8M   Basic metabolic panel   HLD (hyperlipidemia)    Chronic, off meds. Discussed ASCVD - he agrees to try low potency atorvastatin The 10-year ASCVD risk score (Arnett DK, et al., 2019) is: 13.9%   Values used to calculate the score:     Age: 81 years     Sex: Male     Is Non-Hispanic African American: No     Diabetic: No     Tobacco smoker: No     Systolic Blood Pressure: 034 mmHg     Is BP treated: Yes     HDL Cholesterol: 55.6 mg/dL     Total Cholesterol: 208 mg/dL       Relevant Medications   atorvastatin (LIPITOR) 20 MG tablet   Hepatic steatosis   Obesity, morbid, BMI 40.0-49.9 ([place])    Encouraged healthy diet and lifestyle choices to affect sustainable weight loss.       Primary hypertension    Chronic, stable on current regimen.       Relevant Medications   atorvastatin (LIPITOR) 20 MG tablet   Atherosclerosis of aorta (HCC)    Start statin.       Relevant Medications   atorvastatin (LIPITOR) 20 MG tablet   Other Visit Diagnoses     Special screening for malignant neoplasm of prostate       Relevant Orders   PSA        Meds ordered this encounter  Medications   atorvastatin (LIPITOR) 20 MG tablet    Sig: Take 1 tablet (20 mg total) by mouth daily.    Dispense:  90 tablet    Refill:  3   Orders Placed This Encounter   Procedures   Hemoglobin A1c   PSA   Basic metabolic panel    Patient instructions: Send [place] dates of your COVID boosters to update your chart.  Labs today  Start atorvastatin '20mg'$  daily - let us know if any muscle aches develop.  Good to see you today.  Return as needed or in 6 months for blood pressure follow up visit.   Follow up plan: Return in about 6 months (around 12/14/2021) for follow up visit.  Ria Bush, MD

## 2021-06-13 NOTE — Telephone Encounter (Signed)
Jordan Reynolds - Client ?Nonclinical Telephone Record  ?[facility]? ?Client Jordan Reynolds - Client ?Client Site [place] ?Provider Ria Bush - MD ?Contact Type Call ?Who Is Calling Patient / Member / Family / Caregiver ?Caller Name Jordan Reynolds ?Caller Phone Number [phone] ?Patient Name Jordan Reynolds ?Patient DOB 03/23/1956 ?Call Type Message Only Information Provided ?Reason for Call Request to [facility] Appointment ?Initial Comment Caller states he needs to cancel an appt. ?Patient request to speak to RN No ?Additional Comment Office hours provided. ?Disp. Time Disposition Final User ?06/11/2021 5:16:25 PM General Information Provided Yes Schaldach, Rachael ?Call Closed By: Gillian Scarce ?Transaction Date/Time: 06/11/2021 5:13:24 PM (ET ?

## 2021-06-13 NOTE — Assessment & Plan Note (Signed)
Encouraged healthy diet and lifestyle choices to affect sustainable weight loss.  ?

## 2021-06-13 NOTE — Assessment & Plan Note (Signed)
Chronic, stable on current regimen.  

## 2021-06-13 NOTE — Assessment & Plan Note (Signed)
Preventative protocols reviewed and updated unless pt declined. Discussed healthy diet and lifestyle.  

## 2021-06-13 NOTE — Patient Instructions (Addendum)
Send [place] dates of your COVID boosters to update your chart.  ?Labs today  ?Start atorvastatin '20mg'$  daily - let us know if any muscle aches develop.  ?Good to see you today.  ?Return as needed or in 6 months for blood pressure follow up visit.  ? ?Health Maintenance, Male ?Adopting a healthy lifestyle and getting preventive care are important in promoting health and wellness. Ask your health care provider about: ?The right schedule for you to have regular tests and exams. ?Things you can do on your own to prevent diseases and keep yourself healthy. ?What should I know about diet, weight, and exercise? ?Eat a healthy diet ? ?Eat a diet that includes plenty of vegetables, fruits, low-fat dairy products, and lean protein. ?Do not eat a lot of foods that are high in solid fats, added sugars, or sodium. ?Maintain a healthy weight ?Body mass index (BMI) is a measurement that can be used to identify possible weight problems. It estimates body fat based on height and weight. Your health care provider can help determine your BMI and help you achieve or maintain a healthy weight. ?Get regular exercise ?Get regular exercise. This is one of the most important things you can do for your health. Most adults should: ?Exercise for at least 150 minutes each week. The exercise should increase your heart rate and make you sweat (moderate-intensity exercise). ?Do strengthening exercises at least twice a week. This is in addition to the moderate-intensity exercise. ?Spend less time sitting. Even light physical activity can be beneficial. ?Watch cholesterol and blood lipids ?Have your blood tested for lipids and cholesterol at 64 years of age, then have this test every 5 years. ?You may need to have your cholesterol levels checked more often if: ?Your lipid or cholesterol levels are high. ?You are older than 64 years of age. ?You are at high risk for heart disease. ?What should I know about cancer screening? ?Many types of cancers can be  detected early and may often be prevented. Depending on your health history and family history, you may need to have cancer screening at various ages. This may include screening for: ?Colorectal cancer. ?Prostate cancer. ?Skin cancer. ?Lung cancer. ?What should I know about heart disease, diabetes, and high blood pressure? ?Blood pressure and heart disease ?High blood pressure causes heart disease and increases the risk of stroke. This is more likely to develop in people who have high blood pressure readings or are overweight. ?Talk with your health care provider about your target blood pressure readings. ?Have your blood pressure checked: ?Every 3-5 years if you are 1-12 years of age. ?Every year if you are 94 years old or older. ?If you are between the ages of 58 and 53 and are a current or former smoker, ask your health care provider if you should have a one-time screening for abdominal aortic aneurysm (AAA). ?Diabetes ?Have regular diabetes screenings. This checks your fasting blood sugar level. Have the screening done: ?Once every three years after age 90 if you are at a normal weight and have a low risk for diabetes. ?More often and at a younger age if you are overweight or have a high risk for diabetes. ?What should I know about preventing infection? ?Hepatitis B ?If you have a higher risk for hepatitis B, you should be screened for this virus. Talk with your health care provider to find out if you are at risk for hepatitis B infection. ?Hepatitis C ?Blood testing is recommended for: ?Everyone born from  1943 through 35. ?Anyone with known risk factors for hepatitis C. ?Sexually transmitted infections (STIs) ?You should be screened each year for STIs, including gonorrhea and chlamydia, if: ?You are sexually active and are younger than 64 years of age. ?You are older than 64 years of age and your health care provider tells you that you are at risk for this type of infection. ?Your sexual activity has changed  since you were last screened, and you are at increased risk for chlamydia or gonorrhea. Ask your health care provider if you are at risk. ?Ask your health care provider about whether you are at high risk for HIV. Your health care provider may recommend a prescription medicine to help prevent HIV infection. If you choose to take medicine to prevent HIV, you should first get tested for HIV. You should then be tested every 3 months for as long as you are taking the medicine. ?Follow these instructions at home: ?Alcohol use ?Do not drink alcohol if your health care provider tells you not to drink. ?If you drink alcohol: ?Limit how much you have to 0-2 drinks a day. ?Know how much alcohol is in your drink. In the [place] one drink equals one 12 oz bottle of beer (355 mL), one 5 oz glass of wine (148 mL), or one 1? oz glass of hard liquor (44 mL). ?Lifestyle ?Do not use any products that contain nicotine or tobacco. These products include cigarettes, chewing tobacco, and vaping devices, such as e-cigarettes. If you need help quitting, ask your health care provider. ?Do not use street drugs. ?Do not share needles. ?Ask your health care provider for help if you need support or information about quitting drugs. ?General instructions ?Schedule regular health, dental, and eye exams. ?Stay current with your vaccines. ?Tell your health care provider if: ?You often feel depressed. ?You have ever been abused or do not feel safe at home. ?Summary ?Adopting a healthy lifestyle and getting preventive care are important in promoting health and wellness. ?Follow your health care provider's instructions about healthy diet, exercising, and getting tested or screened for diseases. ?Follow your health care provider's instructions on monitoring your cholesterol and blood pressure. ?This information is not intended to replace advice given to you by your health care provider. Make sure you discuss any questions you have with your health care  provider. ?Document Revised: 08/09/2020 Document Reviewed: 08/09/2020 ?Elsevier Patient Education ? [place]. ? ?

## 2021-06-13 NOTE — Telephone Encounter (Signed)
I spoke with pt and his planned changed and he could keep appt this morning.pt has already had his appt today and appreciated the f/u call but nothing further needed. ?

## 2021-06-13 NOTE — Assessment & Plan Note (Addendum)
Chronic, off meds. Discussed ASCVD - he agrees to try low potency atorvastatin ?The 10-year ASCVD risk score (Arnett DK, et al., 2019) is: 13.9% ?  Values used to calculate the score: ?    Age: 66 years ?    Sex: Male ?    Is Non-Hispanic African American: No ?    Diabetic: No ?    Tobacco smoker: No ?    Systolic Blood Pressure: 096 mmHg ?    Is BP treated: Yes ?    HDL Cholesterol: 55.6 mg/dL ?    Total Cholesterol: 208 mg/dL  ?

## 2021-12-11 IMAGING — CT CT ABD-PELV W/O CM
2 of 4 series · 16 of 46 positions shown, 18 images · non-contrast
Comparison: CT abdomen pelvis 04/05/2017

CLINICAL DATA: Hematuria, unknown cause

EXAM:
CT ABDOMEN AND PELVIS WITHOUT CONTRAST
TECHNIQUE: Multidetector CT imaging of the abdomen and pelvis was performed
following the standard protocol without IV contrast.

[Series 2: routine abd/pel wo · axial · 0.93mm/px · z∈[-574,-99]mm · 13 of 105 slices shown, 15 images]
[im 5/105  soft-tissue]
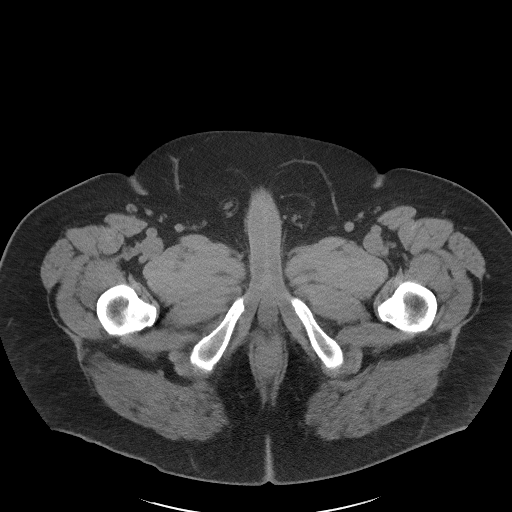
[im 5/105  bone]
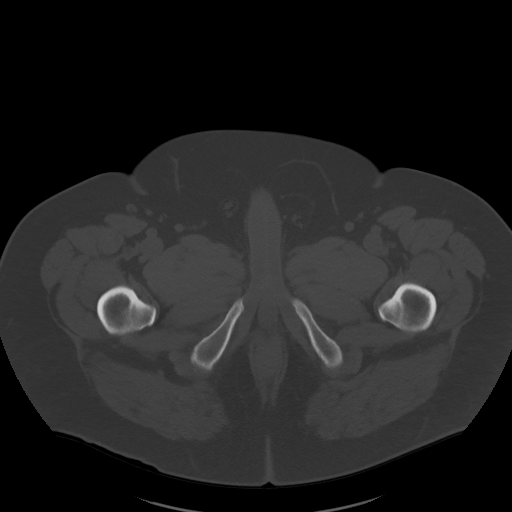
[im 13/105  soft-tissue]
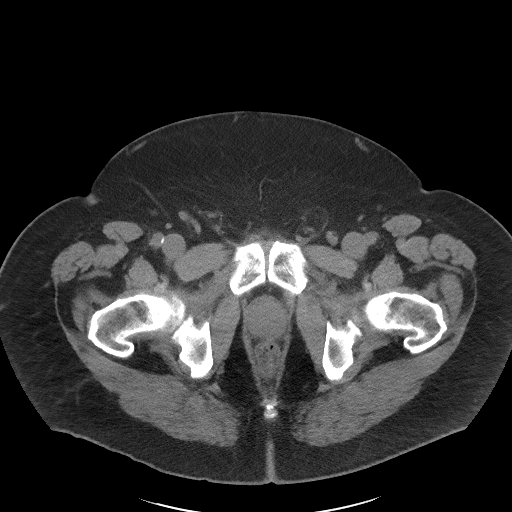
[im 21/105  soft-tissue]
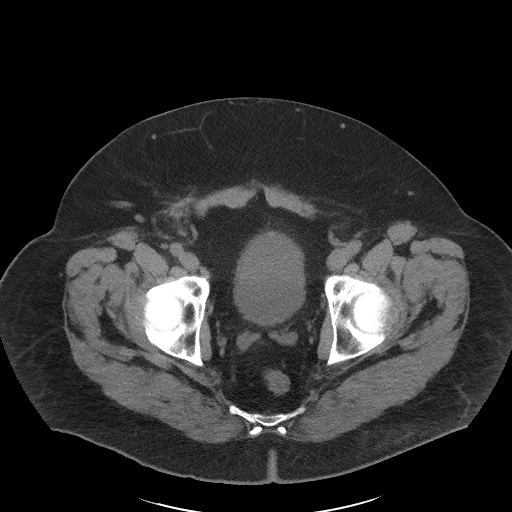
[im 30/105  soft-tissue]
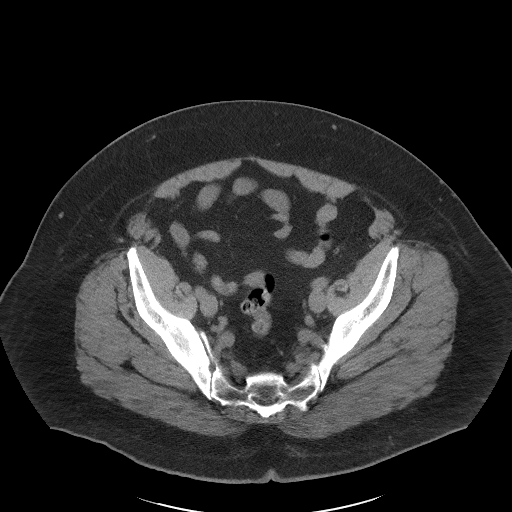
[im 38/105  soft-tissue]
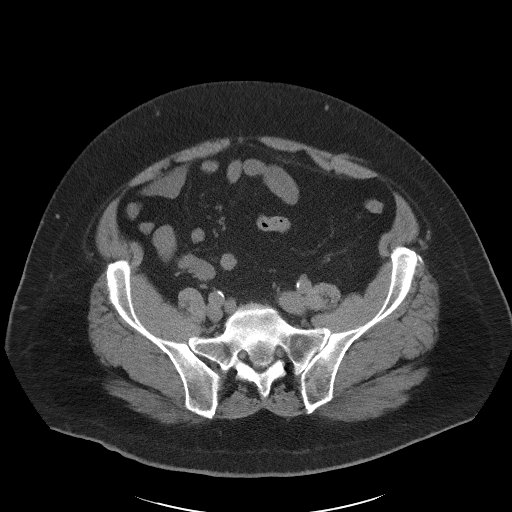
[im 46/105  soft-tissue]
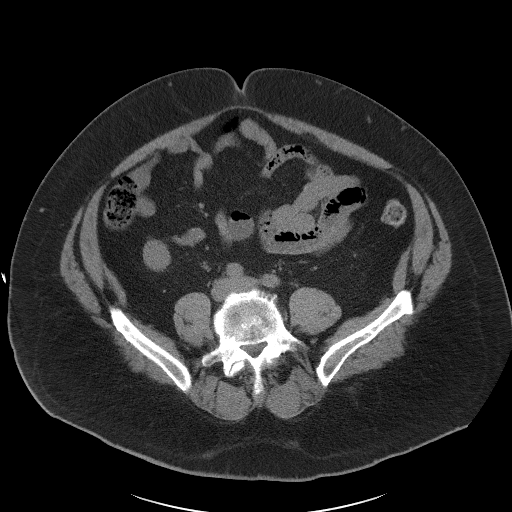
[im 55/105  soft-tissue]
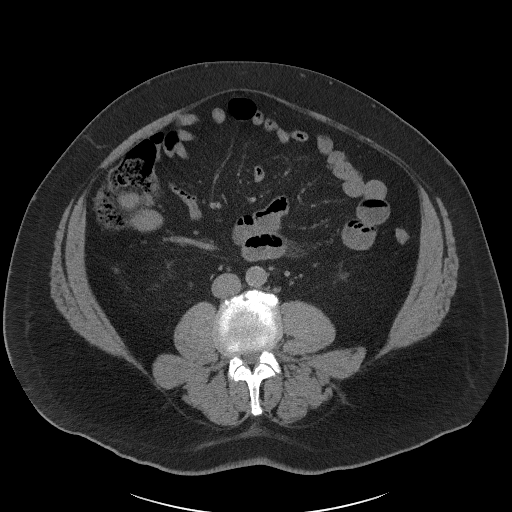
[im 59/105  soft-tissue]
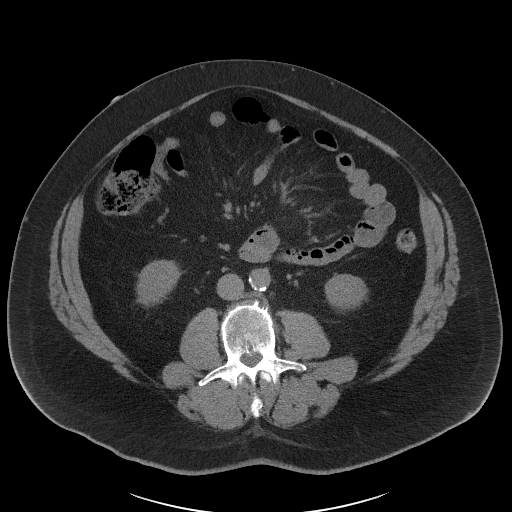
[im 67/105  soft-tissue]
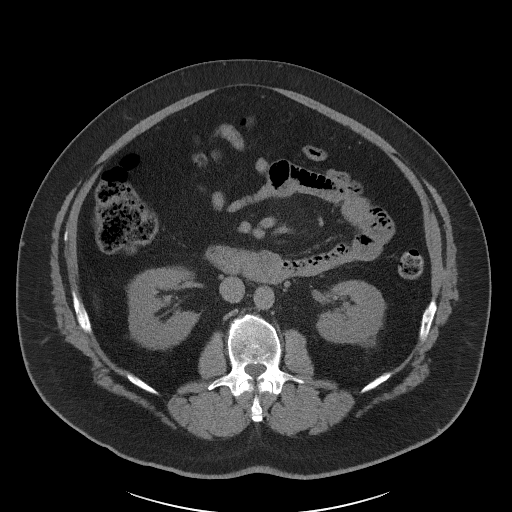
[im 67/105  bone]
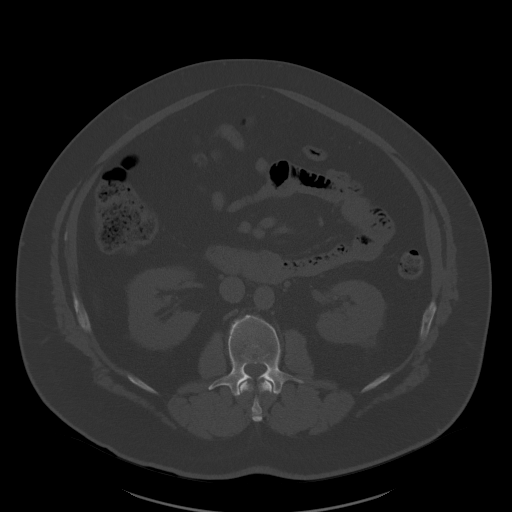
[im 75/105  soft-tissue]
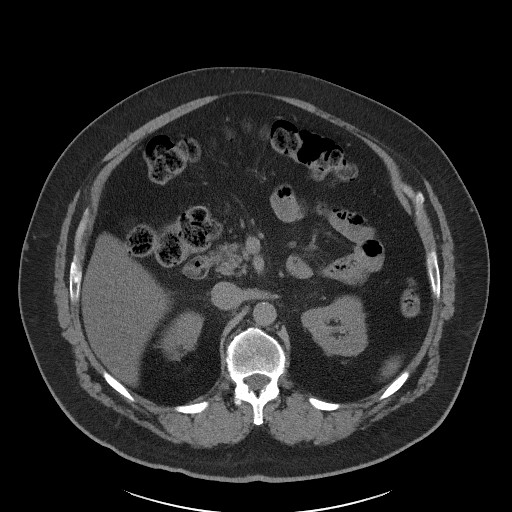
[im 84/105  soft-tissue]
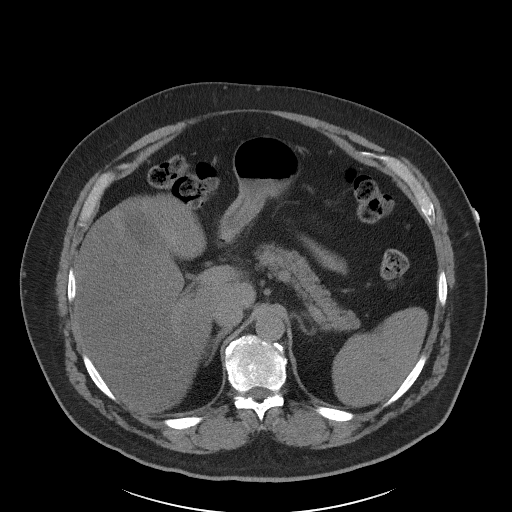
[im 92/105  soft-tissue]
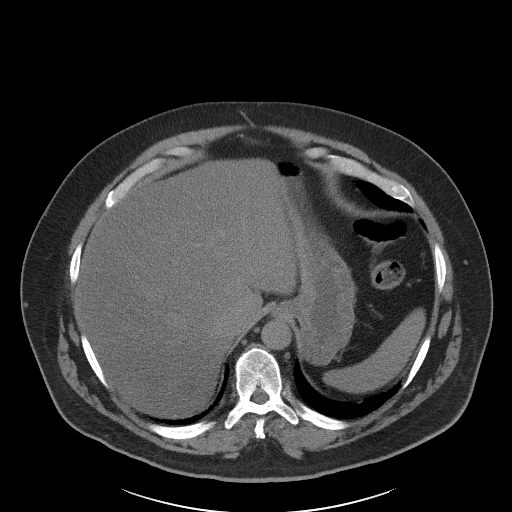
[im 100/105  soft-tissue]
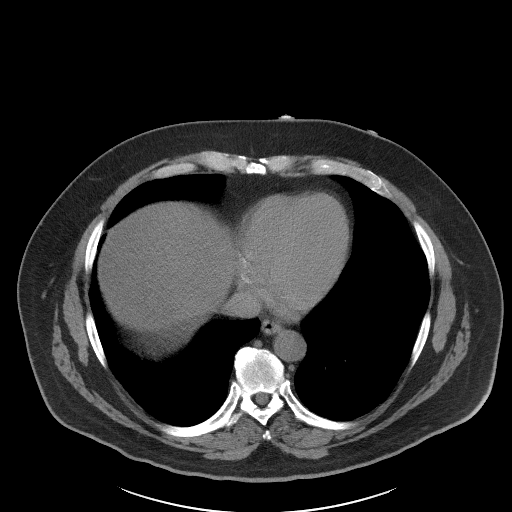

[Series 5: coronal st · coronal · 0.94mm/px · 3 of 133 slices shown]
[im 45/133  soft-tissue]
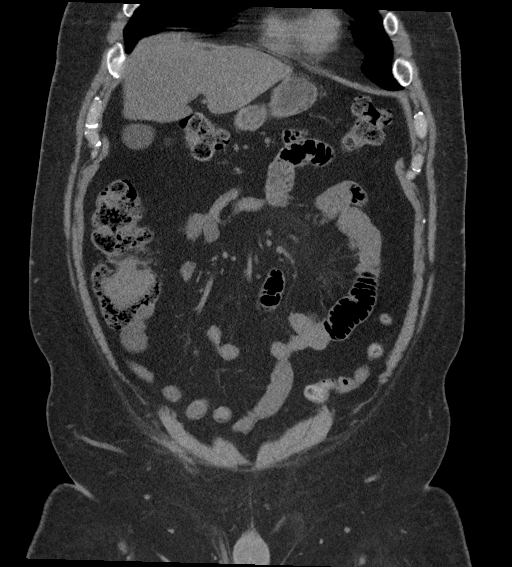
[im 59/133  soft-tissue]
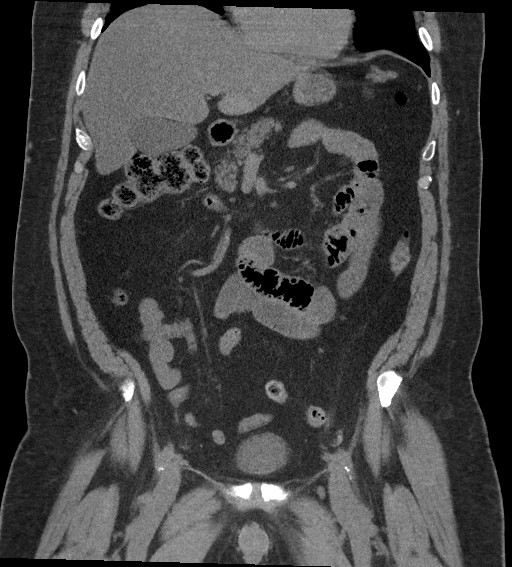
[im 74/133  soft-tissue]
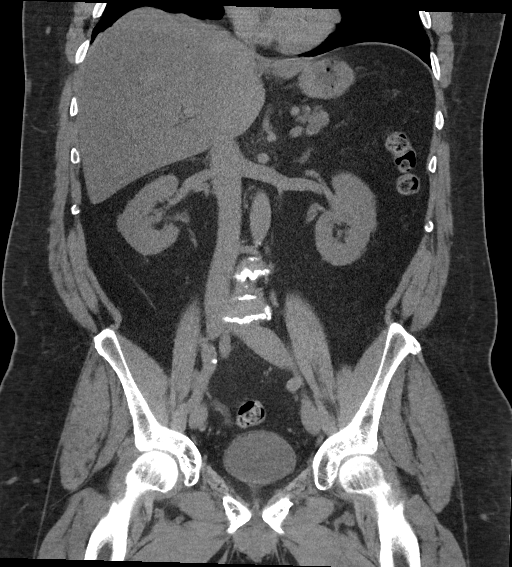

[16 of 46 positions shown; findings below may reference images not displayed]

FINDINGS: Lower chest: Bibasilar hypoventilatory changes.

Hepatobiliary: Hepatic steatosis. No gallstones, gallbladder wall
thickening, or biliary dilatation.

Pancreas: Unremarkable. No pancreatic ductal dilatation or
surrounding inflammatory changes.

Spleen: Normal in size without focal abnormality.

Adrenals/Urinary Tract: Adrenal glands are unremarkable. No
hydronephrosis or nephrolithiasis. Mild contour nodularity of the
left mid kidney posteriorly as well as a hypodense area just
superiorly, and another at the superior pole the right kidney which
could represent small renal cysts.

Stomach/Bowel: The stomach is unremarkable. There is no evidence of
bowel obstruction. The appendix is normal. There is scattered
colonic diverticuli. No bowel wall thickening or significant
inflammatory changes.

Vascular/Lymphatic: Aortoiliac atherosclerotic calcifications. No
AAA. No lymphadenopathy.

Reproductive: Unremarkable.

Other: Small fat containing left inguinal hernia.  No ascites.

Musculoskeletal: No acute osseous abnormality. No suspicious lytic
or blastic lesions. Multilevel degenerative changes of the spine.
Bilateral hip degenerative changes.
IMPRESSION: Mild bladder wall thickening with adjacent stranding, correlate with
urinalysis for cystitis.

No hydronephrosis or nephrolithiasis.

Possible small bilateral renal lesions, incompletely evaluated on
noncontrast CT, which could be small cysts. This should be further
evaluated with nonemergent, multiphase contrast enhanced CT (CT
urogram) as an outpatient given the history of hematuria.

Hepatic steatosis.

## 2022-02-07 ENCOUNTER — Other Ambulatory Visit: Payer: Self-pay | Admitting: Family Medicine

## 2022-02-07 DIAGNOSIS — I10 Essential (primary) hypertension: Secondary | ICD-10-CM

## 2022-02-07 NOTE — Telephone Encounter (Signed)
LVM for patient to call back and schedule

## 2022-02-07 NOTE — Telephone Encounter (Signed)
E-scribed refill.  Plz schedule CPE and lab visits to prevent delays in future refills.  

## 2022-02-07 NOTE — Telephone Encounter (Signed)
Noted  

## 2022-04-08 IMAGING — CT CT ABDOMEN WO/W CM
3 of 13 series · 12 of 46 positions shown, 18 images · IV contrast (omnipaque)
Comparison: Multiple priors including CT 11/18/2020.

CLINICAL DATA: Further characterization of bilateral renal lesions

EXAM:
CT ABDOMEN WITHOUT AND WITH CONTRAST
TECHNIQUE: Multidetector CT imaging of the abdomen was performed following the
standard protocol before and following the bolus administration of
intravenous contrast.
CONTRAST:  100mL OMNIPAQUE IOHEXOL 350 MG/ML SOLN

[Series 2: axial without renal without 2.00 · axial · non-contrast · 0.82mm/px · z∈[-1377,-1139]mm · 6 of 167 slices shown, 11 images]
[im 24/167  soft-tissue]
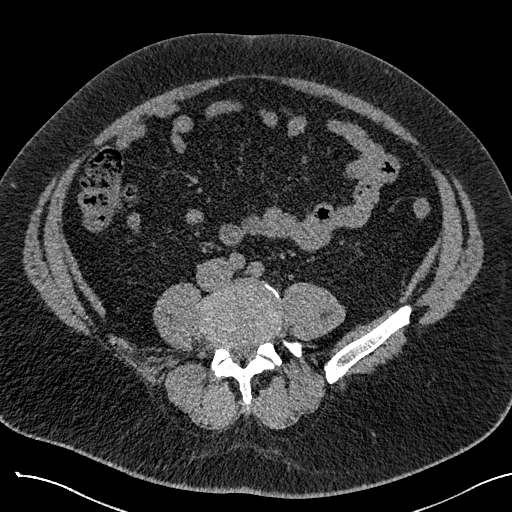
[im 24/167  bone]
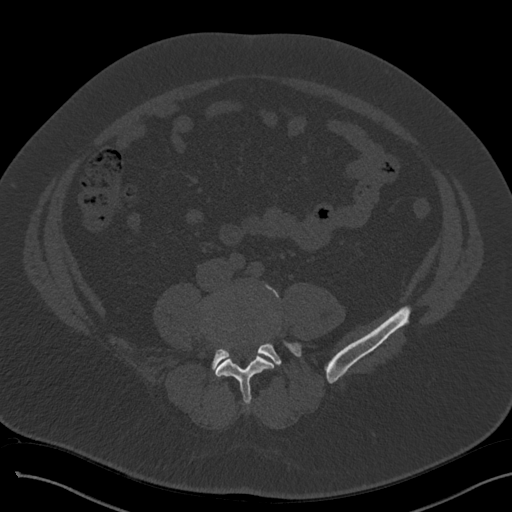
[im 48/167  soft-tissue]
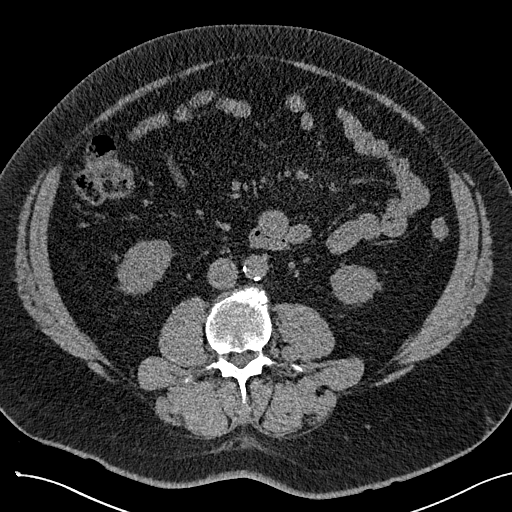
[im 72/167  soft-tissue]
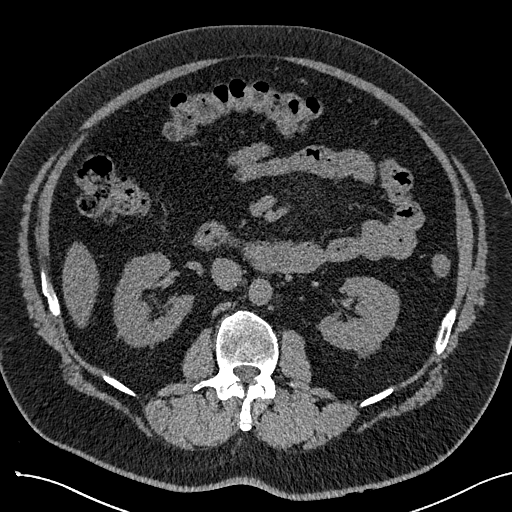
[im 72/167  lung]
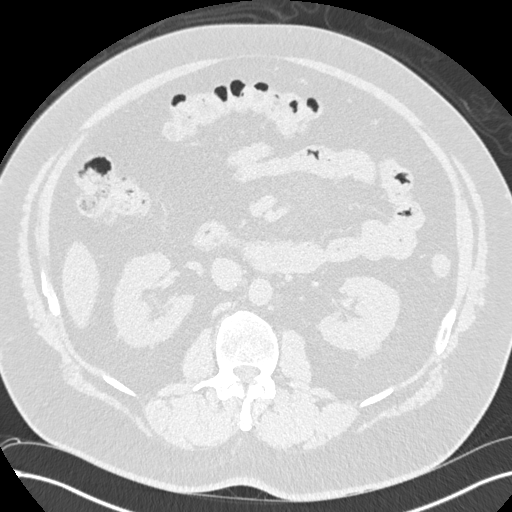
[im 95/167  soft-tissue]
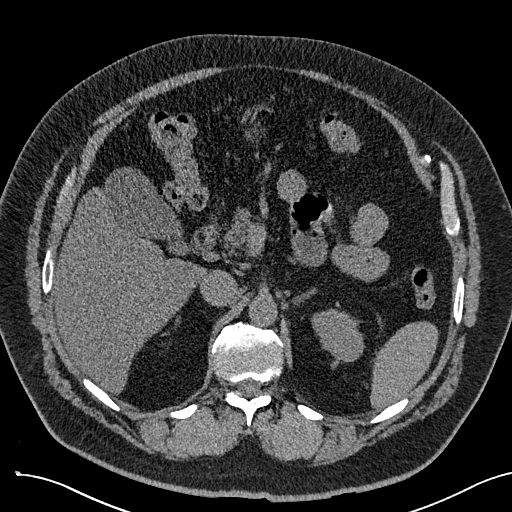
[im 95/167  lung]
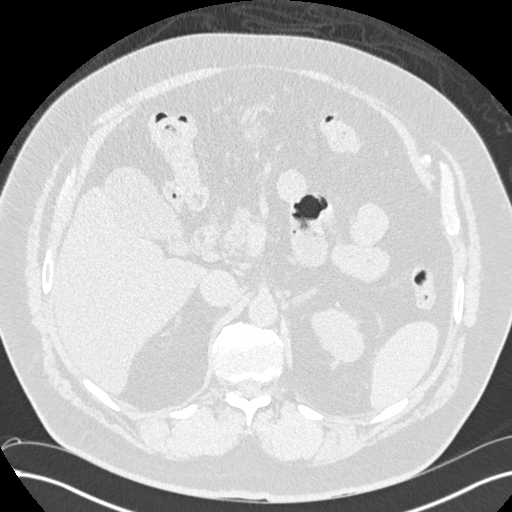
[im 119/167  soft-tissue]
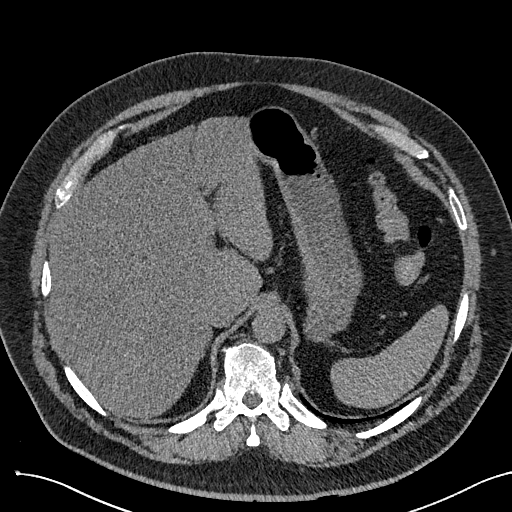
[im 119/167  lung]
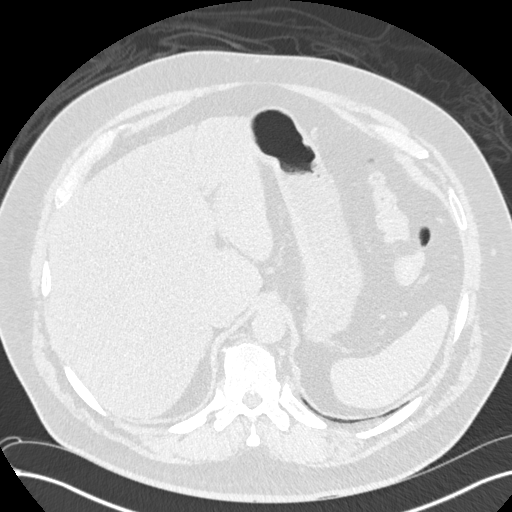
[im 143/167  soft-tissue]
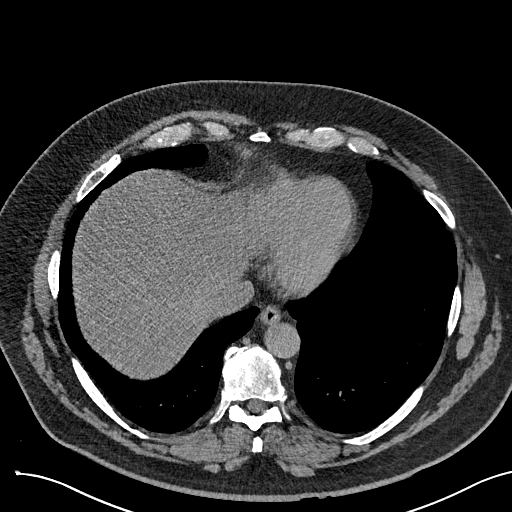
[im 143/167  lung]
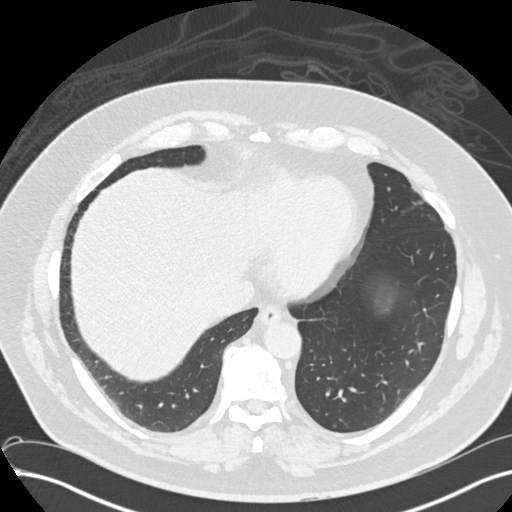

[Series 4: coronal without renal without 2.00 cor · coronal · non-contrast · 0.65mm/px · 2 of 195 slices shown, 3 images]
[im 65/195  soft-tissue]
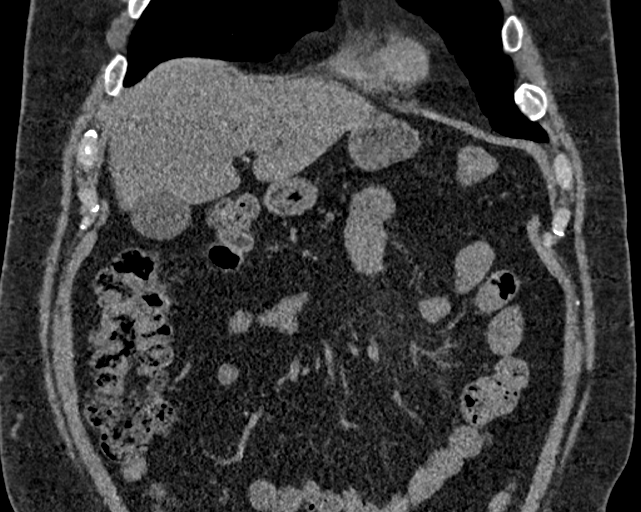
[im 65/195  bone]
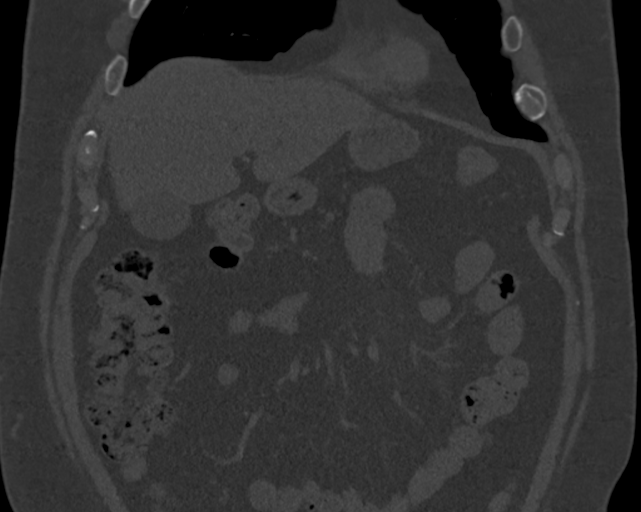
[im 130/195  soft-tissue]
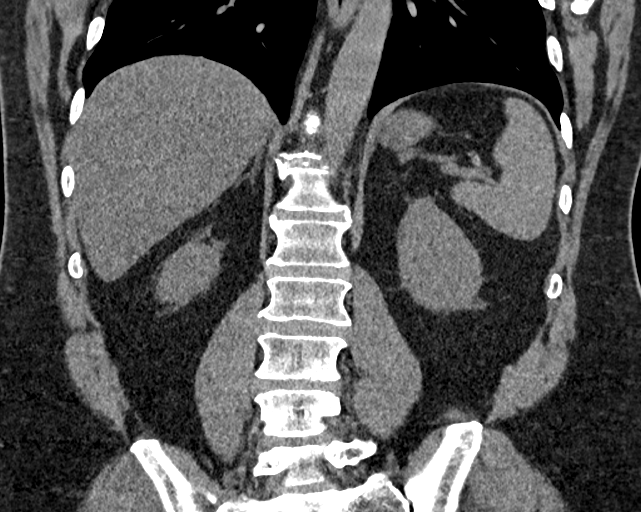

[Series 8: axial arterial renal arterial 2.00 · axial · arterial · 0.82mm/px · z∈[-1369,-1203]mm · 4 of 167 slices shown]
[im 28/167  soft-tissue]
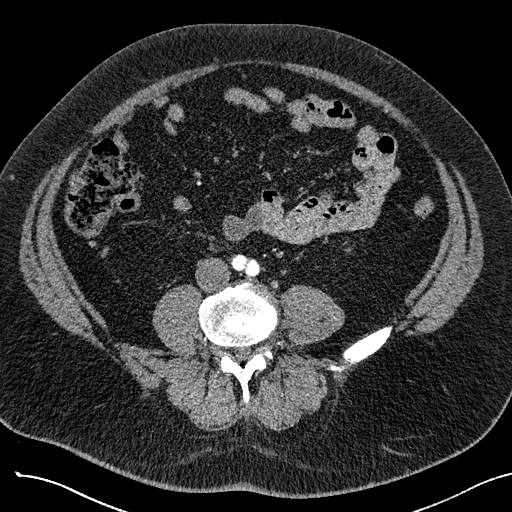
[im 56/167  soft-tissue]
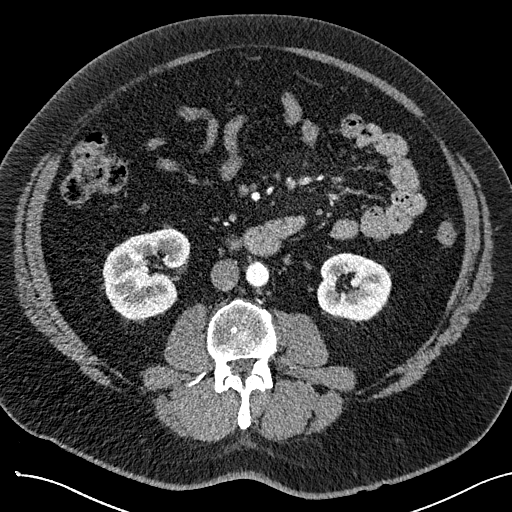
[im 84/167  soft-tissue]
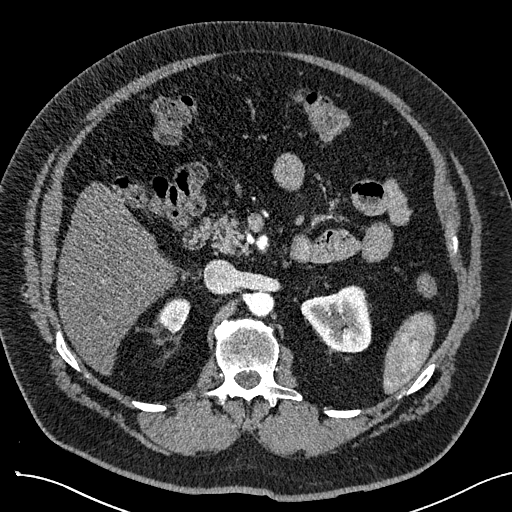
[im 111/167  soft-tissue]
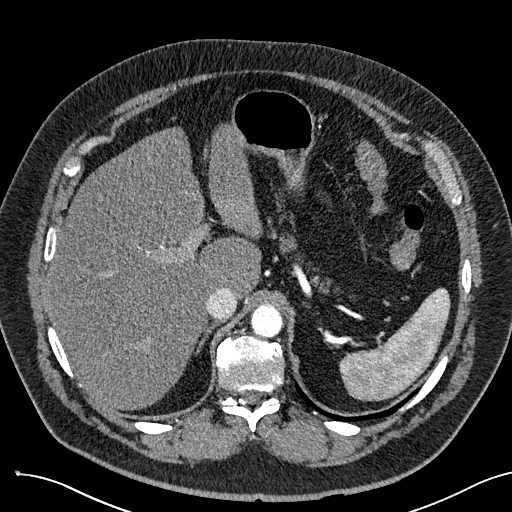

[12 of 46 positions shown; findings below may reference images not displayed]

FINDINGS: Lower chest: Bibasilar micronodularity measuring up to 2-3 mm likely
infectious or inflammatory.

Hepatobiliary: Hepatomegaly with diffuse hepatic steatosis and focal
fatty sparing along the gallbladder fossa. Gallbladder is
unremarkable. No biliary ductal dilation.

Pancreas: No pancreatic ductal dilation or evidence of acute
inflammation.

Spleen: Within normal limits.

Adrenals/Urinary Tract: Bilateral adrenal glands are unremarkable.

No hydronephrosis. No renal, ureteral or bladder calculi identified.
Bilateral cortical renal cysts measuring up to 12 mm on the right
and 7 mm on the left. No solid enhancing renal mass. The kidneys
demonstrate symmetric enhancement and excretion of contrast
material. No suspicious filling defect visualized within the
opacified portions of the collecting systems or proximal ureters on
delayed imaging.

Stomach/Bowel: No enteric contrast was administered. Stomach is
unremarkable for degree of distension. No pathologic dilation or
evidence of acute inflammation involving loops of large or small
bowel in the abdomen. Terminal ileum and appendix appear normal.

Vascular/Lymphatic: Aortic and branch vessel atherosclerosis without
abdominal aortic aneurysm. No pathologically enlarged abdominal
lymph nodes.

Other: Misty appearance of the jejunal mesentery with small
mesenteric lymph nodes measuring up to 4 mm similar dating back to
April 05, 2017.

Musculoskeletal: Multilevel degenerative changes spine. No acute
osseous abnormality.
IMPRESSION: 1. Bilateral cortical renal cysts measuring up to 12 mm. No solid
enhancing renal mass.
2. Hepatomegaly with diffuse hepatic steatosis.
3. Extensive micronodularity measuring up to 2-3 mm in the bilateral
lung bases, likely infectious or inflammatory.
4.  Aortic Atherosclerosis ([id number]).

## 2022-05-16 ENCOUNTER — Other Ambulatory Visit: Payer: Self-pay | Admitting: Family Medicine

## 2022-05-16 DIAGNOSIS — E7800 Pure hypercholesterolemia, unspecified: Secondary | ICD-10-CM

## 2022-05-16 NOTE — Telephone Encounter (Signed)
E-scribed refill.  Plz schedule CPE and lab visits for additional refills.  

## 2022-05-16 NOTE — Telephone Encounter (Signed)
Lvmtcb, sent mychart message

## 2022-05-29 ENCOUNTER — Encounter: Payer: Self-pay | Admitting: Family Medicine

## 2022-05-29 ENCOUNTER — Ambulatory Visit: Payer: Commercial Managed Care - PPO | Admitting: Family Medicine

## 2022-05-29 VITALS — BP 136/78 | HR 81 | Temp 97.2°F | Ht 73.0 in | Wt 350.2 lb

## 2022-05-29 DIAGNOSIS — J309 Allergic rhinitis, unspecified: Secondary | ICD-10-CM

## 2022-05-29 DIAGNOSIS — R7303 Prediabetes: Secondary | ICD-10-CM | POA: Diagnosis not present

## 2022-05-29 DIAGNOSIS — J22 Unspecified acute lower respiratory infection: Secondary | ICD-10-CM | POA: Insufficient documentation

## 2022-05-29 DIAGNOSIS — I10 Essential (primary) hypertension: Secondary | ICD-10-CM

## 2022-05-29 DIAGNOSIS — R051 Acute cough: Secondary | ICD-10-CM | POA: Diagnosis not present

## 2022-05-29 DIAGNOSIS — E7800 Pure hypercholesterolemia, unspecified: Secondary | ICD-10-CM | POA: Diagnosis not present

## 2022-05-29 LAB — LIPID PANEL
Cholesterol: 128 mg/dL (ref 0–200)
HDL: 48.7 mg/dL (ref >39.00)
LDL Cholesterol: 65 mg/dL (ref 0–99)
NonHDL: 79.58
Total CHOL/HDL Ratio: 3
Triglycerides: 71 mg/dL (ref 0.0–149.0)
VLDL: 14.2 mg/dL (ref 0.0–40.0)

## 2022-05-29 LAB — COMPREHENSIVE METABOLIC PANEL
ALT: 45 U/L (ref 0–53)
AST: 29 U/L (ref 0–37)
Albumin: 4.3 g/dL (ref 3.5–5.2)
Alkaline Phosphatase: 42 U/L (ref 39–117)
BUN: 19 mg/dL (ref 6–23)
CO2: 31 mEq/L (ref 19–32)
Calcium: 9.7 mg/dL (ref 8.4–10.5)
Chloride: 101 mEq/L (ref 96–112)
Creatinine, Ser: 0.86 mg/dL (ref 0.40–1.50)
GFR: 91.22 mL/min (ref >60.00)
Glucose, Bld: 100 mg/dL — ABNORMAL HIGH (ref 70–99)
Potassium: 4.6 mEq/L (ref 3.5–5.1)
Sodium: 140 mEq/L (ref 135–145)
Total Bilirubin: 0.8 mg/dL (ref 0.2–1.2)
Total Protein: 7.2 g/dL (ref 6.0–8.3)

## 2022-05-29 LAB — POC COVID19 BINAXNOW: SARS Coronavirus 2 Ag: NEGATIVE

## 2022-05-29 LAB — HEMOGLOBIN A1C: Hgb A1c MFr Bld: 6.3 % (ref 4.6–6.5)

## 2022-05-29 MED ORDER — ATORVASTATIN CALCIUM 20 MG PO TABS: 20.0000 mg | ORAL_TABLET | Freq: Every day | ORAL | 3 refills | Status: DC

## 2022-05-29 MED ORDER — AMLODIPINE BESY-BENAZEPRIL HCL 5-20 MG PO CAPS: 1.0000 | ORAL_CAPSULE | Freq: Every day | ORAL | 3 refills | Status: DC

## 2022-05-29 MED ORDER — MONTELUKAST SODIUM 10 MG PO TABS: 10.0000 mg | ORAL_TABLET | Freq: Every day | ORAL | 3 refills | Status: DC

## 2022-05-29 NOTE — Assessment & Plan Note (Signed)
Chronic update levels on atorvastatin '20mg'$  daily The 10-year ASCVD risk score (Arnett DK, et al., 2019) is: 14.7%   Values used to calculate the score:     Age: 67 years     Sex: Male     Is Non-Hispanic African American: No     Diabetic: No     Tobacco smoker: No     Systolic Blood Pressure: [id number] mmHg     Is BP treated: Yes     HDL Cholesterol: 55.6 mg/dL     Total Cholesterol: 208 mg/dL

## 2022-05-29 NOTE — Assessment & Plan Note (Addendum)
Anticipate viral given short duration and already noticing recovery.  No signs of bacterial infection.  Supportive measures reviewed.  Update if not improving as expected or if worsening symptoms.  COVID tested negative today.

## 2022-05-29 NOTE — Patient Instructions (Addendum)
Labs today  Continue current medicines - refilled.  Return in 3-4 months for welcome to medicare visit.  Stop extra potassium pill.  Work on [employer] and regular exercise routine for goal sustainable weight loss.

## 2022-05-29 NOTE — Assessment & Plan Note (Signed)
Reviewed weight gain noted. Encouraged healthy diet and lifestyle choices for goal sustainable weight loss.

## 2022-05-29 NOTE — Assessment & Plan Note (Signed)
Update A1c with recent weight gain.

## 2022-05-29 NOTE — Assessment & Plan Note (Signed)
Longstanding on singulair - he's taking in am. Advised to take nightly. Unsure when this was started.  Will refill as he notes benefit with this, denies mood changes on this medication.

## 2022-05-29 NOTE — Assessment & Plan Note (Signed)
Chronic, BP stable on lotrel - continue this.

## 2022-05-29 NOTE — Progress Notes (Signed)
Patient ID: Jordan Reynolds, male    DOB: 1956-02-13, 67 y.o.   MRN: [id number]  This visit was conducted in person.  BP 136/78   Pulse 81   Temp (!) 97.2 F (36.2 C) (Temporal)   Ht '6\' 1"'$  (1.854 m)   Wt (!) 350 lb 4 oz (158.9 kg)   SpO2 98%   BMI 46.21 kg/m    CC: HTN f/u visit  Subjective:   HPI: Jordan Reynolds is a 67 y.o. male presenting on 05/29/2022 for Medical Management of Chronic Issues (Here for HTN f/u. Also, c/o cough, chest/head congestion. Sxs started 05/22/22 after returning from a cruise. )   HTN - Compliant with current antihypertensive regimen of lotrel 5/'20mg'$  daily.  Does check blood pressures at home: 130/70s.  No low blood pressure readings or symptoms of dizziness/syncope.  Denies HA, vision changes, CP/tightness, SOB, leg swelling.   HLD - tolerating atorvastatin well.   Longterm on singulair for allergies - unsure where this was started. Denies h/o asthma. Requests this refilled.   Cough with head and chest congestion that started 1 wk ago after returning from cruise. "Bronchial" cough with wheezing, productive of sputum. Abd wall soreness from cough. He has been sleeping  better last few nights. No fevers/chills, ST, ear or tooth pain. Last few days has started feeling better.   Went on 9 cruises last year.  20 lb weight gain in the past year.      Relevant past medical, surgical, family and social history reviewed and updated as indicated. Interim medical history since our last visit reviewed. Allergies and medications reviewed and updated. Outpatient Medications Prior to Visit  Medication Sig Dispense Refill   Ascorbic Acid (VITAMIN C) 1000 MG tablet Take 1,000 mg by mouth daily.     Cyanocobalamin (VITAMIN B12 PO) Take by mouth daily.     MAGNESIUM PO Take by mouth daily.     amLODipine-benazepril (LOTREL) 5-20 MG capsule TAKE 1 CAPSULE BY MOUTH EVERY DAY 90 capsule 1   atorvastatin (LIPITOR) 20 MG tablet TAKE 1 TABLET BY MOUTH EVERY DAY 90 tablet  0   montelukast (SINGULAIR) 10 MG tablet SMARTSIG:1 Tablet(s) By Mouth Every Evening     POTASSIUM PO Take by mouth daily.     No facility-administered medications prior to visit.     Per HPI unless specifically indicated in ROS section below Review of Systems  Objective:  BP 136/78   Pulse 81   Temp (!) 97.2 F (36.2 C) (Temporal)   Ht '6\' 1"'$  (1.854 m)   Wt (!) 350 lb 4 oz (158.9 kg)   SpO2 98%   BMI 46.21 kg/m   Wt Readings from Last 3 Encounters:  05/29/22 (!) 350 lb 4 oz (158.9 kg)  06/13/21 (!) 333 lb (151 kg)  03/16/21 (!) 337 lb 7 oz (153.1 kg)      Physical Exam Vitals and nursing note reviewed.  Constitutional:      Appearance: Normal appearance. He is not ill-appearing.  Eyes:     Extraocular Movements: Extraocular movements intact.     Pupils: Pupils are equal, round, and reactive to light.  Neck:     Thyroid: No thyroid mass or thyromegaly.  Cardiovascular:     Rate and Rhythm: Normal rate and regular rhythm.     Pulses: Normal pulses.     Heart sounds: Normal heart sounds. No murmur heard. Pulmonary:     Effort: Pulmonary effort is normal. No respiratory distress.  Breath sounds: Normal breath sounds. No wheezing, rhonchi or rales.  Musculoskeletal:     Cervical back: Normal range of motion and neck supple. No rigidity.     Right lower leg: Edema (tr) present.     Left lower leg: Edema (tr) present.  Lymphadenopathy:     Cervical: No cervical adenopathy.  Skin:    General: Skin is warm and dry.     Findings: No rash.  Neurological:     Mental Status: He is alert.  Psychiatric:        Mood and Affect: Mood normal.        Behavior: Behavior normal.       Results for orders placed or performed in visit on 06/13/21  Hemoglobin A1c  Result Value Ref Range   Hgb A1c MFr Bld 6.1 4.6 - 6.5 %  PSA  Result Value Ref Range   PSA 0.42 0.10 - 4.00 ng/mL  Basic metabolic panel  Result Value Ref Range   Sodium 137 135 - 145 mEq/L   Potassium 5.1  3.5 - 5.1 mEq/L   Chloride 101 96 - 112 mEq/L   CO2 26 19 - 32 mEq/L   Glucose, Bld 109 (H) 70 - 99 mg/dL   BUN 20 6 - 23 mg/dL   Creatinine, Ser 0.95 0.40 - 1.50 mg/dL   GFR 84.90 >60.00 mL/min   Calcium 9.7 8.4 - 10.5 mg/dL      05/29/2022   10:36 AM 12/18/2018    4:11 PM 12/12/2017    4:06 PM  Depression screen PHQ 2/9  Decreased Interest 2 0 0  Down, Depressed, Hopeless 1 1 0  PHQ - 2 Score 3 1 0  Altered sleeping 1    Tired, decreased energy 2    Change in appetite 1    Feeling bad or failure about yourself  1    Trouble concentrating 0    Moving slowly or fidgety/restless 0    Suicidal thoughts 0    PHQ-9 Score 8    Difficult doing work/chores Somewhat difficult     Situational due to recent illness    05/29/2022   10:36 AM  GAD 7 : Generalized Anxiety Score  Nervous, Anxious, on Edge 1  Control/stop worrying 0  Worry too much - different things 1  Trouble relaxing 0  Restless 0  Easily annoyed or irritable 0  Afraid - awful might happen 0  Total GAD 7 Score 2  Anxiety Difficulty Somewhat difficult   Assessment & Plan:   Problem List Items Addressed This Visit     Prediabetes    Update A1c with recent weight gain.       Relevant Orders   Hemoglobin A1c   HLD (hyperlipidemia)    Chronic update levels on atorvastatin '20mg'$  daily The 10-year ASCVD risk score (Arnett DK, et al., 2019) is: 14.7%   Values used to calculate the score:     Age: 74 years     Sex: Male     Is Non-Hispanic African American: No     Diabetic: No     Tobacco smoker: No     Systolic Blood Pressure: [id number] mmHg     Is BP treated: Yes     HDL Cholesterol: 55.6 mg/dL     Total Cholesterol: 208 mg/dL       Relevant Medications   amLODipine-benazepril (LOTREL) 5-20 MG capsule   atorvastatin (LIPITOR) 20 MG tablet   Other Relevant Orders   Lipid panel  Comprehensive metabolic panel   Obesity, morbid, BMI 40.0-49.9 ([place])    Reviewed weight gain noted. Encouraged healthy diet and  lifestyle choices for goal sustainable weight loss.       Primary hypertension - Primary    Chronic, BP stable on lotrel - continue this.       Relevant Medications   amLODipine-benazepril (LOTREL) 5-20 MG capsule   atorvastatin (LIPITOR) 20 MG tablet   Acute respiratory infection    Anticipate viral given short duration and already noticing recovery.  No signs of bacterial infection.  Supportive measures reviewed.  Update if not improving as expected or if worsening symptoms.       Allergic rhinitis    Longstanding on singulair - he's taking in am. Advised to take nightly. Unsure when this was started.  Will refill as he notes benefit with this, denies mood changes on this medication.        Other Visit Diagnoses     Acute cough       Relevant Orders   POC COVID-19 BinaxNow        Meds ordered this encounter  Medications   amLODipine-benazepril (LOTREL) 5-20 MG capsule    Sig: Take 1 capsule by mouth daily.    Dispense:  90 capsule    Refill:  3   atorvastatin (LIPITOR) 20 MG tablet    Sig: Take 1 tablet (20 mg total) by mouth daily.    Dispense:  90 tablet    Refill:  3   montelukast (SINGULAIR) 10 MG tablet    Sig: Take 1 tablet (10 mg total) by mouth at bedtime.    Dispense:  90 tablet    Refill:  3    Orders Placed This Encounter  Procedures   Lipid panel   Comprehensive metabolic panel   Hemoglobin A1c   POC COVID-19 BinaxNow    Order Specific Question:   Previously tested for COVID-19    Answer:   Yes    Order Specific Question:   Resident in a congregate (group) care setting    Answer:   No    Order Specific Question:   Employed in healthcare setting    Answer:   No    Patient Instructions  Labs today  Continue current medicines - refilled.  Return in 3-4 months for welcome to medicare visit.  Stop extra potassium pill.  Work on [employer] and regular exercise routine for goal sustainable weight loss.  Follow up plan: Return in about 4  months (around 09/27/2022).  Ria Bush, MD

## 2023-01-23 DIAGNOSIS — R059 Cough, unspecified: Secondary | ICD-10-CM | POA: Diagnosis not present

## 2023-01-23 DIAGNOSIS — Z6841 Body Mass Index (BMI) 40.0 and over, adult: Secondary | ICD-10-CM | POA: Diagnosis not present

## 2023-01-23 DIAGNOSIS — R7301 Impaired fasting glucose: Secondary | ICD-10-CM | POA: Diagnosis not present

## 2023-01-23 DIAGNOSIS — E7800 Pure hypercholesterolemia, unspecified: Secondary | ICD-10-CM | POA: Diagnosis not present

## 2023-01-23 DIAGNOSIS — M79671 Pain in right foot: Secondary | ICD-10-CM | POA: Diagnosis not present

## 2023-01-23 DIAGNOSIS — Z23 Encounter for immunization: Secondary | ICD-10-CM | POA: Diagnosis not present

## 2023-01-23 DIAGNOSIS — J410 Simple chronic bronchitis: Secondary | ICD-10-CM | POA: Diagnosis not present

## 2023-01-23 DIAGNOSIS — R0602 Shortness of breath: Secondary | ICD-10-CM | POA: Diagnosis not present

## 2023-01-23 DIAGNOSIS — R053 Chronic cough: Secondary | ICD-10-CM | POA: Diagnosis not present

## 2023-01-24 DIAGNOSIS — R0602 Shortness of breath: Secondary | ICD-10-CM | POA: Diagnosis not present

## 2023-01-24 DIAGNOSIS — R7301 Impaired fasting glucose: Secondary | ICD-10-CM | POA: Diagnosis not present

## 2023-01-24 DIAGNOSIS — E7800 Pure hypercholesterolemia, unspecified: Secondary | ICD-10-CM | POA: Diagnosis not present

## 2023-01-26 DIAGNOSIS — R058 Other specified cough: Secondary | ICD-10-CM | POA: Diagnosis not present

## 2023-01-26 DIAGNOSIS — R059 Cough, unspecified: Secondary | ICD-10-CM | POA: Diagnosis not present

## 2023-01-26 DIAGNOSIS — J410 Simple chronic bronchitis: Secondary | ICD-10-CM | POA: Diagnosis not present

## 2023-01-30 ENCOUNTER — Other Ambulatory Visit: Payer: Self-pay | Admitting: Family Medicine

## 2023-01-30 DIAGNOSIS — I10 Essential (primary) hypertension: Secondary | ICD-10-CM

## 2023-02-22 DIAGNOSIS — H90A22 Sensorineural hearing loss, unilateral, left ear, with restricted hearing on the contralateral side: Secondary | ICD-10-CM | POA: Diagnosis not present

## 2023-02-22 DIAGNOSIS — K219 Gastro-esophageal reflux disease without esophagitis: Secondary | ICD-10-CM | POA: Diagnosis not present

## 2023-02-22 DIAGNOSIS — H903 Sensorineural hearing loss, bilateral: Secondary | ICD-10-CM | POA: Diagnosis not present

## 2023-02-27 DIAGNOSIS — L821 Other seborrheic keratosis: Secondary | ICD-10-CM | POA: Diagnosis not present

## 2023-02-27 DIAGNOSIS — H2513 Age-related nuclear cataract, bilateral: Secondary | ICD-10-CM | POA: Diagnosis not present

## 2023-02-27 DIAGNOSIS — Z0100 Encounter for examination of eyes and vision without abnormal findings: Secondary | ICD-10-CM | POA: Diagnosis not present

## 2023-02-27 DIAGNOSIS — L918 Other hypertrophic disorders of the skin: Secondary | ICD-10-CM | POA: Diagnosis not present

## 2023-02-27 DIAGNOSIS — D485 Neoplasm of uncertain behavior of skin: Secondary | ICD-10-CM | POA: Diagnosis not present

## 2023-02-27 DIAGNOSIS — L570 Actinic keratosis: Secondary | ICD-10-CM | POA: Diagnosis not present

## 2023-02-27 DIAGNOSIS — D0439 Carcinoma in situ of skin of other parts of face: Secondary | ICD-10-CM | POA: Diagnosis not present

## 2023-04-24 DIAGNOSIS — C44329 Squamous cell carcinoma of skin of other parts of face: Secondary | ICD-10-CM | POA: Diagnosis not present

## 2023-04-24 DIAGNOSIS — D0439 Carcinoma in situ of skin of other parts of face: Secondary | ICD-10-CM | POA: Diagnosis not present

## 2023-04-28 ENCOUNTER — Other Ambulatory Visit: Payer: Self-pay | Admitting: Family Medicine

## 2023-04-28 DIAGNOSIS — I10 Essential (primary) hypertension: Secondary | ICD-10-CM

## 2023-07-27 ENCOUNTER — Other Ambulatory Visit: Payer: Self-pay | Admitting: Family Medicine

## 2023-07-27 DIAGNOSIS — I10 Essential (primary) hypertension: Secondary | ICD-10-CM

## 2023-07-27 NOTE — Telephone Encounter (Signed)
 Patient does not have follow up and had not been seen in office after 05/29/22

## 2023-07-29 ENCOUNTER — Other Ambulatory Visit: Payer: Self-pay | Admitting: Family Medicine

## 2023-07-29 DIAGNOSIS — E7800 Pure hypercholesterolemia, unspecified: Secondary | ICD-10-CM

## 2023-07-30 NOTE — Telephone Encounter (Signed)
 Needs appt. Pt well overdue for CPE. Per 05/29/22 OV notes, pt was to return around 09/2022 for CPE.   Request denied.

## 2023-07-31 ENCOUNTER — Other Ambulatory Visit: Payer: Self-pay | Admitting: Family Medicine

## 2023-07-31 DIAGNOSIS — E7800 Pure hypercholesterolemia, unspecified: Secondary | ICD-10-CM

## 2023-07-31 DIAGNOSIS — I10 Essential (primary) hypertension: Secondary | ICD-10-CM

## 2023-07-31 MED ORDER — AMLODIPINE BESY-BENAZEPRIL HCL 5-20 MG PO CAPS: 1.0000 | ORAL_CAPSULE | Freq: Every day | ORAL | 0 refills | Status: DC

## 2023-07-31 MED ORDER — ATORVASTATIN CALCIUM 20 MG PO TABS: 20.0000 mg | ORAL_TABLET | Freq: Every day | ORAL | 0 refills | Status: DC

## 2023-07-31 NOTE — Progress Notes (Signed)
ERx Plz schedule CPE.

## 2023-07-31 NOTE — Telephone Encounter (Signed)
 Copied from CRM [id number]. Topic: Clinical - Medication Refill >> Jul 31, 2023 11:49 AM Howard Macho wrote: Most Recent Primary Care Visit:  Provider: Claire Crick  Department: [facility]  Visit Type: OFFICE VISIT  Date: 05/29/2022  Medication: atorvastatin  (LIPITOR) 20 MG tablet and amLODipine -benazepril  (LOTREL) 5-20 MG capsule  Has the patient contacted their pharmacy? Yes (Agent: If no, request that the patient contact the pharmacy for the refill. If patient does not wish to contact the pharmacy document the reason why and proceed with request.) (Agent: If yes, when and what did the pharmacy advise?)  Is this the correct pharmacy for this prescription? Yes If no, delete pharmacy and type the correct one.  This is the patient's preferred pharmacy:  [id number] [place], [place] - [address] [place] [place] [place] [address] Phone: [phone] Fax: [phone]   Has the prescription been filled recently? No  Is the patient out of the medication? Yes  Has the patient been seen for an appointment in the last year OR does the patient have an upcoming appointment? Yes  Can we respond through MyChart? Yes  Agent: Please be advised that Rx refills may take up to 3 business days. We ask that you follow-up with your pharmacy.

## 2023-07-31 NOTE — Telephone Encounter (Signed)
Patient is scheduled for CPE

## 2023-07-31 NOTE — Telephone Encounter (Signed)
ERx Plz schedule CPE.

## 2023-07-31 NOTE — Telephone Encounter (Signed)
 Noted.

## 2023-08-19 ENCOUNTER — Other Ambulatory Visit: Payer: Self-pay | Admitting: Family Medicine

## 2023-08-19 DIAGNOSIS — J309 Allergic rhinitis, unspecified: Secondary | ICD-10-CM

## 2023-08-21 DIAGNOSIS — M5412 Radiculopathy, cervical region: Secondary | ICD-10-CM | POA: Diagnosis not present

## 2023-08-21 DIAGNOSIS — M546 Pain in thoracic spine: Secondary | ICD-10-CM | POA: Diagnosis not present

## 2023-08-21 DIAGNOSIS — M9901 Segmental and somatic dysfunction of cervical region: Secondary | ICD-10-CM | POA: Diagnosis not present

## 2023-08-21 DIAGNOSIS — M9902 Segmental and somatic dysfunction of thoracic region: Secondary | ICD-10-CM | POA: Diagnosis not present

## 2023-08-22 DIAGNOSIS — M5412 Radiculopathy, cervical region: Secondary | ICD-10-CM | POA: Diagnosis not present

## 2023-08-22 DIAGNOSIS — M9901 Segmental and somatic dysfunction of cervical region: Secondary | ICD-10-CM | POA: Diagnosis not present

## 2023-08-22 DIAGNOSIS — M9902 Segmental and somatic dysfunction of thoracic region: Secondary | ICD-10-CM | POA: Diagnosis not present

## 2023-08-22 DIAGNOSIS — M546 Pain in thoracic spine: Secondary | ICD-10-CM | POA: Diagnosis not present

## 2023-08-23 DIAGNOSIS — M546 Pain in thoracic spine: Secondary | ICD-10-CM | POA: Diagnosis not present

## 2023-08-23 DIAGNOSIS — M9901 Segmental and somatic dysfunction of cervical region: Secondary | ICD-10-CM | POA: Diagnosis not present

## 2023-08-23 DIAGNOSIS — M9902 Segmental and somatic dysfunction of thoracic region: Secondary | ICD-10-CM | POA: Diagnosis not present

## 2023-08-23 DIAGNOSIS — M5412 Radiculopathy, cervical region: Secondary | ICD-10-CM | POA: Diagnosis not present

## 2023-08-28 DIAGNOSIS — M546 Pain in thoracic spine: Secondary | ICD-10-CM | POA: Diagnosis not present

## 2023-08-28 DIAGNOSIS — M5412 Radiculopathy, cervical region: Secondary | ICD-10-CM | POA: Diagnosis not present

## 2023-08-28 DIAGNOSIS — M9901 Segmental and somatic dysfunction of cervical region: Secondary | ICD-10-CM | POA: Diagnosis not present

## 2023-08-28 DIAGNOSIS — M9902 Segmental and somatic dysfunction of thoracic region: Secondary | ICD-10-CM | POA: Diagnosis not present

## 2023-08-29 DIAGNOSIS — M9901 Segmental and somatic dysfunction of cervical region: Secondary | ICD-10-CM | POA: Diagnosis not present

## 2023-08-29 DIAGNOSIS — M5412 Radiculopathy, cervical region: Secondary | ICD-10-CM | POA: Diagnosis not present

## 2023-08-29 DIAGNOSIS — M9902 Segmental and somatic dysfunction of thoracic region: Secondary | ICD-10-CM | POA: Diagnosis not present

## 2023-08-29 DIAGNOSIS — M546 Pain in thoracic spine: Secondary | ICD-10-CM | POA: Diagnosis not present

## 2023-08-30 DIAGNOSIS — M9901 Segmental and somatic dysfunction of cervical region: Secondary | ICD-10-CM | POA: Diagnosis not present

## 2023-08-30 DIAGNOSIS — M9902 Segmental and somatic dysfunction of thoracic region: Secondary | ICD-10-CM | POA: Diagnosis not present

## 2023-08-30 DIAGNOSIS — M546 Pain in thoracic spine: Secondary | ICD-10-CM | POA: Diagnosis not present

## 2023-08-30 DIAGNOSIS — M5412 Radiculopathy, cervical region: Secondary | ICD-10-CM | POA: Diagnosis not present

## 2023-09-03 DIAGNOSIS — M546 Pain in thoracic spine: Secondary | ICD-10-CM | POA: Diagnosis not present

## 2023-09-03 DIAGNOSIS — M9901 Segmental and somatic dysfunction of cervical region: Secondary | ICD-10-CM | POA: Diagnosis not present

## 2023-09-03 DIAGNOSIS — M9902 Segmental and somatic dysfunction of thoracic region: Secondary | ICD-10-CM | POA: Diagnosis not present

## 2023-09-03 DIAGNOSIS — M5412 Radiculopathy, cervical region: Secondary | ICD-10-CM | POA: Diagnosis not present

## 2023-09-05 DIAGNOSIS — M9901 Segmental and somatic dysfunction of cervical region: Secondary | ICD-10-CM | POA: Diagnosis not present

## 2023-09-05 DIAGNOSIS — M546 Pain in thoracic spine: Secondary | ICD-10-CM | POA: Diagnosis not present

## 2023-09-05 DIAGNOSIS — M9902 Segmental and somatic dysfunction of thoracic region: Secondary | ICD-10-CM | POA: Diagnosis not present

## 2023-09-05 DIAGNOSIS — M5412 Radiculopathy, cervical region: Secondary | ICD-10-CM | POA: Diagnosis not present

## 2023-09-10 DIAGNOSIS — M5412 Radiculopathy, cervical region: Secondary | ICD-10-CM | POA: Diagnosis not present

## 2023-09-10 DIAGNOSIS — M9901 Segmental and somatic dysfunction of cervical region: Secondary | ICD-10-CM | POA: Diagnosis not present

## 2023-09-10 DIAGNOSIS — M9902 Segmental and somatic dysfunction of thoracic region: Secondary | ICD-10-CM | POA: Diagnosis not present

## 2023-09-10 DIAGNOSIS — M546 Pain in thoracic spine: Secondary | ICD-10-CM | POA: Diagnosis not present

## 2023-09-12 DIAGNOSIS — M9901 Segmental and somatic dysfunction of cervical region: Secondary | ICD-10-CM | POA: Diagnosis not present

## 2023-09-12 DIAGNOSIS — M5412 Radiculopathy, cervical region: Secondary | ICD-10-CM | POA: Diagnosis not present

## 2023-09-12 DIAGNOSIS — M546 Pain in thoracic spine: Secondary | ICD-10-CM | POA: Diagnosis not present

## 2023-09-12 DIAGNOSIS — M9902 Segmental and somatic dysfunction of thoracic region: Secondary | ICD-10-CM | POA: Diagnosis not present

## 2023-09-18 DIAGNOSIS — H524 Presbyopia: Secondary | ICD-10-CM | POA: Diagnosis not present

## 2023-09-18 DIAGNOSIS — Z0100 Encounter for examination of eyes and vision without abnormal findings: Secondary | ICD-10-CM | POA: Diagnosis not present

## 2023-09-19 DIAGNOSIS — M5412 Radiculopathy, cervical region: Secondary | ICD-10-CM | POA: Diagnosis not present

## 2023-09-19 DIAGNOSIS — M546 Pain in thoracic spine: Secondary | ICD-10-CM | POA: Diagnosis not present

## 2023-09-19 DIAGNOSIS — M9901 Segmental and somatic dysfunction of cervical region: Secondary | ICD-10-CM | POA: Diagnosis not present

## 2023-09-19 DIAGNOSIS — M9902 Segmental and somatic dysfunction of thoracic region: Secondary | ICD-10-CM | POA: Diagnosis not present

## 2023-09-26 DIAGNOSIS — M9902 Segmental and somatic dysfunction of thoracic region: Secondary | ICD-10-CM | POA: Diagnosis not present

## 2023-09-26 DIAGNOSIS — M5412 Radiculopathy, cervical region: Secondary | ICD-10-CM | POA: Diagnosis not present

## 2023-09-26 DIAGNOSIS — M9901 Segmental and somatic dysfunction of cervical region: Secondary | ICD-10-CM | POA: Diagnosis not present

## 2023-09-26 DIAGNOSIS — M546 Pain in thoracic spine: Secondary | ICD-10-CM | POA: Diagnosis not present

## 2023-10-10 DIAGNOSIS — M546 Pain in thoracic spine: Secondary | ICD-10-CM | POA: Diagnosis not present

## 2023-10-10 DIAGNOSIS — M5412 Radiculopathy, cervical region: Secondary | ICD-10-CM | POA: Diagnosis not present

## 2023-10-10 DIAGNOSIS — M9901 Segmental and somatic dysfunction of cervical region: Secondary | ICD-10-CM | POA: Diagnosis not present

## 2023-10-10 DIAGNOSIS — M9902 Segmental and somatic dysfunction of thoracic region: Secondary | ICD-10-CM | POA: Diagnosis not present

## 2023-10-24 ENCOUNTER — Ambulatory Visit (INDEPENDENT_AMBULATORY_CARE_PROVIDER_SITE_OTHER): Payer: Self-pay | Admitting: Family Medicine

## 2023-10-24 VITALS — BP 138/84 | HR 80 | Temp 98.3°F | Ht 72.0 in | Wt 342.2 lb

## 2023-10-24 DIAGNOSIS — Z7189 Other specified counseling: Secondary | ICD-10-CM | POA: Diagnosis not present

## 2023-10-24 DIAGNOSIS — E7800 Pure hypercholesterolemia, unspecified: Secondary | ICD-10-CM

## 2023-10-24 DIAGNOSIS — N529 Male erectile dysfunction, unspecified: Secondary | ICD-10-CM | POA: Diagnosis not present

## 2023-10-24 DIAGNOSIS — F4323 Adjustment disorder with mixed anxiety and depressed mood: Secondary | ICD-10-CM

## 2023-10-24 DIAGNOSIS — I10 Essential (primary) hypertension: Secondary | ICD-10-CM | POA: Diagnosis not present

## 2023-10-24 DIAGNOSIS — Z23 Encounter for immunization: Secondary | ICD-10-CM

## 2023-10-24 DIAGNOSIS — Z0000 Encounter for general adult medical examination without abnormal findings: Secondary | ICD-10-CM | POA: Diagnosis not present

## 2023-10-24 DIAGNOSIS — Z125 Encounter for screening for malignant neoplasm of prostate: Secondary | ICD-10-CM | POA: Diagnosis not present

## 2023-10-24 DIAGNOSIS — R7303 Prediabetes: Secondary | ICD-10-CM

## 2023-10-24 DIAGNOSIS — K760 Fatty (change of) liver, not elsewhere classified: Secondary | ICD-10-CM

## 2023-10-24 LAB — COMPREHENSIVE METABOLIC PANEL WITH GFR
ALT: 40 U/L (ref 0–53)
AST: 28 U/L (ref 0–37)
Albumin: 4.8 g/dL (ref 3.5–5.2)
Alkaline Phosphatase: 45 U/L (ref 39–117)
BUN: 21 mg/dL (ref 6–23)
CO2: 29 meq/L (ref 19–32)
Calcium: 10.1 mg/dL (ref 8.4–10.5)
Chloride: 102 meq/L (ref 96–112)
Creatinine, Ser: 0.88 mg/dL (ref 0.40–1.50)
GFR: 89.7 mL/min (ref >60.00)
Glucose, Bld: 113 mg/dL — ABNORMAL HIGH (ref 70–99)
Potassium: 4.7 meq/L (ref 3.5–5.1)
Sodium: 141 meq/L (ref 135–145)
Total Bilirubin: 0.8 mg/dL (ref 0.2–1.2)
Total Protein: 7.2 g/dL (ref 6.0–8.3)

## 2023-10-24 LAB — LIPID PANEL
Cholesterol: 170 mg/dL (ref 0–200)
HDL: 63.1 mg/dL (ref >39.00)
LDL Cholesterol: 89 mg/dL (ref 0–99)
NonHDL: 106.55
Total CHOL/HDL Ratio: 3
Triglycerides: 86 mg/dL (ref 0.0–149.0)
VLDL: 17.2 mg/dL (ref 0.0–40.0)

## 2023-10-24 LAB — PSA: PSA: 0.53 ng/mL (ref 0.10–4.00)

## 2023-10-24 LAB — HEMOGLOBIN A1C: Hgb A1c MFr Bld: 6.5 % (ref 4.6–6.5)

## 2023-10-24 MED ORDER — ATORVASTATIN CALCIUM 20 MG PO TABS: 20.0000 mg | ORAL_TABLET | Freq: Every day | ORAL | 3 refills | Status: AC

## 2023-10-24 MED ORDER — SILDENAFIL CITRATE 100 MG PO TABS: 50.0000 mg | ORAL_TABLET | Freq: Every day | ORAL | 6 refills | Status: AC | PRN

## 2023-10-24 MED ORDER — AMLODIPINE BESY-BENAZEPRIL HCL 5-20 MG PO CAPS: 1.0000 | ORAL_CAPSULE | Freq: Every day | ORAL | 3 refills | Status: AC

## 2023-10-24 NOTE — Progress Notes (Unsigned)
 Ph: [phone] Fax: [phone]   Patient ID: Jordan Reynolds, male    DOB: [birth year], 68 y.o.   MRN: [id number]  This visit was conducted in person.  BP 138/84   Pulse 80   Temp 98.3 F (36.8 C) (Oral)   Ht 6' (1.829 m)   Wt (!) 342 lb 4 oz (155.2 kg)   SpO2 99%   BMI 46.42 kg/m    CC: CPE/AMW Subjective:   HPI: Jordan Reynolds is a 68 y.o. male presenting on 10/24/2023 for Annual Exam   Retired at age 55yo.  Got medicare 4 yrs ago.  Did not see health advisor this year.   No results found.  Flowsheet Row Office Visit from 10/24/2023 in [facility]  PHQ-2 Total Score 2       10/24/2023    9:07 AM 05/29/2022   10:36 AM  Fall Risk   Falls in the past year? 0 0   Notes worsening mood, overeating, anhedonia in setting of stressful marital situation. 40 yrs ago wife unfaithful. He separated from wife and moved away - moved to [facility] for 1.5 yrs. Then got back together with wife for 3yo son's sake and have stayed together ever since. More recently found further concerning information.  Not interested in counseling.  Mood is not affecting day to day interactions.  No SI/HI.   H/o primary syphilis 10/2014. Did have negative RPR 3 months after treatment as well as subsequently negative HIV.   Trouble maintaining erection. Requests medication. Tried brother's sildenafil  with benefit.   Preventative: COLONOSCOPY 09/2020 - 8 polyps removed (TAs), rpt 5 yrs [facility]) Prostate cancer screening - father with h/o prostate cancer - continue yearly PSA.  Lung cancer screening - not eligible - quit >15 yrs ago  Flu - yearly  COVID vaccine - Pfizer 06/2019, 07/2019, has had boosters Td 2009, Tdap 10/2012 , Tdap 02/2019 Prevnar-20 today  Shingrix  - 12/2018, 03/2019  Advanced directive - doesn't have set up. Would want daughter Emmit Dudley then son Delrico Minehart to be HCPOA. Full code but wouldn't want prolonged life support if terminal condition.  Advanced directive packet provided today.  Seat belt use discussed  Sunscreen use discussed. No changing moles. Sees derm yearly Ex smoker - quit 1998, 30+ PY hx.  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  Activity: yard work, some walking Diet: some water, some fruits/vegetables, 1 L diet sodas     Relevant past medical, surgical, family and social history reviewed and updated as indicated. Interim medical history since our last visit reviewed. Allergies and medications reviewed and updated. Outpatient Medications Prior to Visit  Medication Sig Dispense Refill   Ascorbic Acid (VITAMIN C) 1000 MG tablet Take 1,000 mg by mouth daily.     Cyanocobalamin (VITAMIN B12 PO) Take by mouth daily.     MAGNESIUM PO Take by mouth daily.     amLODipine -benazepril  (LOTREL) 5-20 MG capsule Take 1 capsule by mouth daily. 90 capsule 0   atorvastatin  (LIPITOR) 20 MG tablet Take 1 tablet (20 mg total) by mouth daily. 90 tablet 0   montelukast  (SINGULAIR ) 10 MG tablet TAKE 1 TABLET BY MOUTH EVERYDAY AT BEDTIME 90 tablet 0  No facility-administered medications prior to visit.     Per HPI unless specifically indicated in ROS section below Review of Systems  Constitutional:  Negative for activity change, appetite change, chills, fatigue, fever and unexpected weight change.  HENT:  Negative for hearing loss.   Eyes:  Negative for visual disturbance.  Respiratory:  Positive for cough (dry). Negative for chest tightness, shortness of breath and wheezing.   Cardiovascular:  Negative for chest pain, palpitations and leg swelling.  Gastrointestinal:  Negative for abdominal distention, abdominal pain, blood in stool, constipation, diarrhea, nausea and vomiting.  Genitourinary:   Negative for difficulty urinating and hematuria.  Musculoskeletal:  Negative for arthralgias, myalgias and neck pain.  Skin:  Negative for rash.  Neurological:  Positive for light-headedness (mild). Negative for dizziness, seizures, syncope and headaches.  Hematological:  Negative for adenopathy. Does not bruise/bleed easily.  Psychiatric/Behavioral:  Positive for dysphoric mood (see HPI). The patient is not nervous/anxious.     Objective:  BP 138/84   Pulse 80   Temp 98.3 F (36.8 C) (Oral)   Ht 6' (1.829 m)   Wt (!) 342 lb 4 oz (155.2 kg)   SpO2 99%   BMI 46.42 kg/m   Wt Readings from Last 3 Encounters:  10/24/23 (!) 342 lb 4 oz (155.2 kg)  05/29/22 (!) 350 lb 4 oz (158.9 kg)  06/13/21 (!) 333 lb (151 kg)      Physical Exam Vitals and nursing note reviewed.  Constitutional:      General: He is not in acute distress.    Appearance: Normal appearance. He is well-developed. He is not ill-appearing.  HENT:     Head: Normocephalic and atraumatic.     Right Ear: Hearing, tympanic membrane, ear canal and external ear normal.     Left Ear: Hearing, tympanic membrane, ear canal and external ear normal.     Mouth/Throat:     Mouth: Mucous membranes are moist.     Pharynx: Oropharynx is clear. No oropharyngeal exudate or posterior oropharyngeal erythema.  Eyes:     General: No scleral icterus.    Extraocular Movements: Extraocular movements intact.     Conjunctiva/sclera: Conjunctivae normal.     Pupils: Pupils are equal, round, and reactive to light.  Neck:     Thyroid : No thyroid  mass or thyromegaly.     Vascular: No carotid bruit.  Cardiovascular:     Rate and Rhythm: Normal rate and regular rhythm.     Pulses: Normal pulses.          Radial pulses are 2+ on the right side and 2+ on the left side.     Heart sounds: Normal heart sounds. No murmur heard. Pulmonary:     Effort: Pulmonary effort is normal. No respiratory distress.     Breath sounds: Normal breath sounds. No  wheezing, rhonchi or rales.  Abdominal:     General: Bowel sounds are normal. There is no distension.     Palpations: Abdomen is soft. There is no mass.     Tenderness: There is no abdominal tenderness. There is no guarding or rebound.     Hernia: No hernia is present.  Musculoskeletal:        General: Normal range of motion.     Cervical back: Normal range of motion and neck supple.     Right lower leg: No edema.     Left lower leg: No edema.  Lymphadenopathy:     Cervical: No cervical adenopathy.  Skin:  General: Skin is warm and dry.     Findings: No rash.  Neurological:     General: No focal deficit present.     Mental Status: He is alert and oriented to person, place, and time.     Comments:  Recall 3/3 Calculation 5/5 DLROW  Psychiatric:        Mood and Affect: Mood normal.        Behavior: Behavior normal.        Thought Content: Thought content normal.        Judgment: Judgment normal.       Results for orders placed or performed in visit on 10/24/23  Lipid panel   Collection Time: 10/24/23  8:43 AM  Result Value Ref Range   Cholesterol 170 0 - 200 mg/dL   Triglycerides 13.9 0.0 - 149.0 mg/dL   HDL 36.89 >60.99 mg/dL   VLDL 82.7 0.0 - 59.9 mg/dL   LDL Cholesterol 89 0 - 99 mg/dL   Total CHOL/HDL Ratio 3    NonHDL 106.55   Comprehensive metabolic panel with GFR   Collection Time: 10/24/23  8:43 AM  Result Value Ref Range   Sodium 141 135 - 145 mEq/L   Potassium 4.7 3.5 - 5.1 mEq/L   Chloride 102 96 - 112 mEq/L   CO2 29 19 - 32 mEq/L   Glucose, Bld 113 (H) 70 - 99 mg/dL   BUN 21 6 - 23 mg/dL   Creatinine, Ser 9.11 0.40 - 1.50 mg/dL   Total Bilirubin 0.8 0.2 - 1.2 mg/dL   Alkaline Phosphatase 45 39 - 117 U/L   AST 28 0 - 37 U/L   ALT 40 0 - 53 U/L   Total Protein 7.2 6.0 - 8.3 g/dL   Albumin 4.8 3.5 - 5.2 g/dL   GFR 10.29 >39.99 mL/min   Calcium  10.1 8.4 - 10.5 mg/dL  Hemoglobin J8r   Collection Time: 10/24/23  8:43 AM  Result Value Ref Range   Hgb  A1c MFr Bld 6.5 4.6 - 6.5 %  PSA   Collection Time: 10/24/23  8:43 AM  Result Value Ref Range   PSA 0.53 0.10 - 4.00 ng/mL   Lab Results  Component Value Date   WBC 6.7 11/19/2020   HGB 13.3 11/19/2020   HCT 40.0 11/19/2020   MCV 88.9 11/19/2020   PLT 141 (L) 11/19/2020       10/24/2023    9:08 AM 05/29/2022   10:36 AM 12/18/2018    4:11 PM 12/12/2017    4:06 PM  Depression screen PHQ 2/9  Decreased Interest 1 2 0 0  Down, Depressed, Hopeless 1 1 1  0  PHQ - 2 Score 2 3 1  0  Altered sleeping 2 1    Tired, decreased energy 0 2    Change in appetite 1 1    Feeling bad or failure about yourself  0 1    Trouble concentrating 1 0    Moving slowly or fidgety/restless 1 0    Suicidal thoughts 0 0    PHQ-9 Score 7 8    Difficult doing work/chores Somewhat difficult Somewhat difficult         10/24/2023    9:09 AM 05/29/2022   10:36 AM  GAD 7 : Generalized Anxiety Score  Nervous, Anxious, on Edge 2 1  Control/stop worrying 0 0  Worry too much - different things 1 1  Trouble relaxing 3 0  Restless 2 0  Easily annoyed or irritable  0 0  Afraid - awful might happen 0 0  Total GAD 7 Score 8 2  Anxiety Difficulty Somewhat difficult Somewhat difficult   Assessment & Plan:   Problem List Items Addressed This Visit     Health maintenance examination (Chronic)   Preventative protocols reviewed and updated unless pt declined. Discussed healthy diet and lifestyle.       Advanced directives, counseling/discussion (Chronic)   Advanced directive - doesn't have set up. Would want daughter Emmit Dudley then son Kiara Keep to be HCPOA. Full code but wouldn't want prolonged life support if terminal condition. Advanced directive packet provided today.       Medicare annual wellness visit, initial - Primary (Chronic)   I have personally reviewed the Medicare Annual Wellness questionnaire and have noted 1. The patient's medical and social history 2. Their use of alcohol, tobacco or  illicit drugs 3. Their current medications and supplements 4. The patient's functional ability including ADL's, fall risks, home safety risks and hearing or visual impairment. Cognitive function has been assessed and addressed as indicated.  5. Diet and physical activity 6. Evidence for depression or mood disorders The patients weight, height, BMI have been recorded in the chart. I have made referrals, counseling and provided education to the patient based on review of the above and I have provided the pt with a written personalized care plan for preventive services. Provider list updated.. See scanned questionairre as needed for further documentation. Reviewed preventative protocols and updated unless pt declined.       Prediabetes   Update A1c      Relevant Orders   Hemoglobin A1c (Completed)   HLD (hyperlipidemia)   Chronic, update levels on atorvastatin  20mg  daily. The ASCVD Risk score (Arnett DK, et al., 2019) failed to calculate for the following reasons:   The valid total cholesterol range is 130 to 320 mg/dL       Relevant Medications   amLODipine -benazepril  (LOTREL) 5-20 MG capsule   atorvastatin  (LIPITOR) 20 MG tablet   sildenafil  (VIAGRA ) 100 MG tablet   Other Relevant Orders   Lipid panel (Completed)   Comprehensive metabolic panel with GFR (Completed)   Metabolic dysfunction-associated fatty liver disease (MAFLD)   Previously noted on imaging.  Will see if we can add CBC to blood drawn.       Obesity, morbid, BMI 40.0-49.9 (HCC)   Noted weight loss - encouraged continued healthy diet and lifestyle choices to affect sustainable weight loss  Rec return for further weight loss discussion if interested.       Primary hypertension   Chronic, stable. Continue current regimen       Relevant Medications   amLODipine -benazepril  (LOTREL) 5-20 MG capsule   atorvastatin  (LIPITOR) 20 MG tablet   sildenafil  (VIAGRA ) 100 MG tablet   Erectile dysfunction   Desires trial  of Viagra . He can start with 50 mg dose, and increase to 100 mg if necessary. The method of use 1 hour prior to anticipated intercourse is explained. He should not use any more than one tablet in a 24 hour period. The side effects of possible headache, flushing, dyspepsia and transient changes in vision have been explained. Advised to seek urgent medical care if he develops priapism. The patient is not taking nitrates. I have counseled him that taking Viagra  with nitrates of any form can cause life threatening drops in blood pressure. Additionally,       Adjustment disorder with mixed anxiety and depressed mood   Discussed contributory  marital situation  However mood is not affecting day to day activities.  Doubt medication would be of benefit here. He's not interested in counseling.  Support provided. Will continue to monitor mood.       Other Visit Diagnoses       Special screening for malignant neoplasm of prostate       Relevant Orders   PSA (Completed)     Need for vaccination against Streptococcus pneumoniae       Relevant Orders   Pneumococcal conjugate vaccine 20-valent (Prevnar 20) (Completed)        Meds ordered this encounter  Medications   amLODipine -benazepril  (LOTREL) 5-20 MG capsule    Sig: Take 1 capsule by mouth daily.    Dispense:  90 capsule    Refill:  3   atorvastatin  (LIPITOR) 20 MG tablet    Sig: Take 1 tablet (20 mg total) by mouth daily.    Dispense:  90 tablet    Refill:  3   sildenafil  (VIAGRA ) 100 MG tablet    Sig: Take 0.5-1 tablets (50-100 mg total) by mouth daily as needed for erectile dysfunction.    Dispense:  5 tablet    Refill:  6    Orders Placed This Encounter  Procedures   Pneumococcal conjugate vaccine 20-valent (Prevnar 20)   Lipid panel   Comprehensive metabolic panel with GFR   Hemoglobin A1c   PSA    Patient Instructions  Prevnar-20 today  Labs today  Advanced directive packet provided today.  If you'd like return to  discuss possible weight loss medications.  Good to see you today  Return as needed or in 1 year for next physical  Follow up plan: Return in about 1 year (around 10/23/2024) for annual exam, prior fasting for blood work, medicare wellness visit.  Anton Blas, MD

## 2023-10-24 NOTE — Assessment & Plan Note (Signed)
 Preventative protocols reviewed and updated unless pt declined. Discussed healthy diet and lifestyle.

## 2023-10-24 NOTE — Assessment & Plan Note (Signed)
 Desires trial of Viagra . He can start with 50 mg dose, and increase to 100 mg if necessary. The method of use 1 hour prior to anticipated intercourse is explained. He should not use any more than one tablet in a 24 hour period. The side effects of possible headache, flushing, dyspepsia and transient changes in vision have been explained. Advised to seek urgent medical care if he develops priapism. The patient is not taking nitrates. I have counseled him that taking Viagra  with nitrates of any form can cause life threatening drops in blood pressure. Additionally,

## 2023-10-24 NOTE — Assessment & Plan Note (Signed)
 Update A1c ?

## 2023-10-24 NOTE — Assessment & Plan Note (Signed)

## 2023-10-24 NOTE — Assessment & Plan Note (Signed)
 Advanced directive - doesn't have set up. Would want daughter Emmit Dudley then son Jarrad Mclees to be HCPOA. Full code but wouldn't want prolonged life support if terminal condition. Advanced directive packet provided today.

## 2023-10-24 NOTE — Assessment & Plan Note (Signed)
 Chronic, update levels on atorvastatin  20mg  daily. The ASCVD Risk score (Arnett DK, et al., 2019) failed to calculate for the following reasons:   The valid total cholesterol range is 130 to 320 mg/dL

## 2023-10-24 NOTE — Patient Instructions (Addendum)
 Prevnar-20 today  Labs today  Advanced directive packet provided today.  If you'd like return to discuss possible weight loss medications.  Good to see you today  Return as needed or in 1 year for next physical

## 2023-10-24 NOTE — Assessment & Plan Note (Signed)
 Chronic, stable. Continue current regimen.

## 2023-10-25 ENCOUNTER — Ambulatory Visit: Payer: Self-pay | Admitting: Family Medicine

## 2023-10-25 ENCOUNTER — Ambulatory Visit (INDEPENDENT_AMBULATORY_CARE_PROVIDER_SITE_OTHER)

## 2023-10-25 DIAGNOSIS — Z125 Encounter for screening for malignant neoplasm of prostate: Secondary | ICD-10-CM

## 2023-10-25 DIAGNOSIS — Z23 Encounter for immunization: Secondary | ICD-10-CM

## 2023-10-25 DIAGNOSIS — R7303 Prediabetes: Secondary | ICD-10-CM | POA: Diagnosis not present

## 2023-10-25 DIAGNOSIS — Z7189 Other specified counseling: Secondary | ICD-10-CM

## 2023-10-25 DIAGNOSIS — E7800 Pure hypercholesterolemia, unspecified: Secondary | ICD-10-CM | POA: Diagnosis not present

## 2023-10-25 DIAGNOSIS — R454 Irritability and anger: Secondary | ICD-10-CM | POA: Insufficient documentation

## 2023-10-25 DIAGNOSIS — Z0000 Encounter for general adult medical examination without abnormal findings: Secondary | ICD-10-CM

## 2023-10-25 DIAGNOSIS — K760 Fatty (change of) liver, not elsewhere classified: Secondary | ICD-10-CM

## 2023-10-25 DIAGNOSIS — N529 Male erectile dysfunction, unspecified: Secondary | ICD-10-CM

## 2023-10-25 DIAGNOSIS — I10 Essential (primary) hypertension: Secondary | ICD-10-CM

## 2023-10-25 DIAGNOSIS — F4323 Adjustment disorder with mixed anxiety and depressed mood: Secondary | ICD-10-CM

## 2023-10-25 LAB — CBC WITH DIFFERENTIAL/PLATELET
Basophils Absolute: 0.1 K/uL (ref 0.0–0.1)
Basophils Relative: 1.5 % (ref 0.0–3.0)
Eosinophils Absolute: 0.2 K/uL (ref 0.0–0.7)
Eosinophils Relative: 3.3 % (ref 0.0–5.0)
HCT: 42.6 % (ref 39.0–52.0)
Hemoglobin: 13.9 g/dL (ref 13.0–17.0)
Lymphocytes Relative: 32.9 % (ref 12.0–46.0)
Lymphs Abs: 1.7 K/uL (ref 0.7–4.0)
MCHC: 32.6 g/dL (ref 30.0–36.0)
MCV: 88.3 fl (ref 78.0–100.0)
Monocytes Absolute: 0.7 K/uL (ref 0.1–1.0)
Monocytes Relative: 12.6 % — ABNORMAL HIGH (ref 3.0–12.0)
Neutro Abs: 2.6 K/uL (ref 1.4–7.7)
Neutrophils Relative %: 49.7 % (ref 43.0–77.0)
Platelets: 181 K/uL (ref 150.0–400.0)
RBC: 4.83 Mil/uL (ref 4.22–5.81)
RDW: 13.9 % (ref 11.5–15.5)
WBC: 5.3 K/uL (ref 4.0–10.5)

## 2023-10-25 NOTE — Assessment & Plan Note (Addendum)
 Noted weight loss - encouraged continued healthy diet and lifestyle choices to affect sustainable weight loss  Rec return for further weight loss discussion if interested.

## 2023-10-25 NOTE — Assessment & Plan Note (Addendum)
 Previously noted on imaging.  Will see if we can add CBC to blood drawn.

## 2023-10-25 NOTE — Addendum Note (Signed)
 Addended by: HOPE VEVA PARAS on: 10/25/2023 09:49 AM   Modules accepted: Orders

## 2023-10-25 NOTE — Assessment & Plan Note (Signed)
 Discussed contributory marital situation  However mood is not affecting day to day activities.  Doubt medication would be of benefit here. He's not interested in counseling.  Support provided. Will continue to monitor mood.

## 2024-01-09 ENCOUNTER — Telehealth: Payer: Self-pay

## 2024-01-09 NOTE — Telephone Encounter (Signed)
 Copied from CRM #[id number]. Topic: General - Other >> Jan 09, 2024  8:44 AM [place] A wrote: Reason for CRM: Patient wants to know if the appt 01/14/24 if he could be for weight loss? Please contact  Left message to return call to our office.  Appointment is for follow up I have added request to app note

## 2024-01-14 ENCOUNTER — Ambulatory Visit: Admitting: Family Medicine

## 2024-01-14 ENCOUNTER — Encounter: Payer: Self-pay | Admitting: Family Medicine

## 2024-01-14 ENCOUNTER — Other Ambulatory Visit (HOSPITAL_COMMUNITY): Payer: Self-pay

## 2024-01-14 ENCOUNTER — Telehealth: Payer: Self-pay

## 2024-01-14 VITALS — BP 118/80 | HR 84 | Temp 98.1°F | Ht 72.0 in | Wt 349.5 lb

## 2024-01-14 DIAGNOSIS — E1169 Type 2 diabetes mellitus with other specified complication: Secondary | ICD-10-CM

## 2024-01-14 DIAGNOSIS — Z23 Encounter for immunization: Secondary | ICD-10-CM

## 2024-01-14 DIAGNOSIS — Z6841 Body Mass Index (BMI) 40.0 and over, adult: Secondary | ICD-10-CM | POA: Diagnosis not present

## 2024-01-14 DIAGNOSIS — Z7985 Long-term (current) use of injectable non-insulin antidiabetic drugs: Secondary | ICD-10-CM | POA: Diagnosis not present

## 2024-01-14 LAB — POCT GLYCOSYLATED HEMOGLOBIN (HGB A1C): Hemoglobin A1C: 6.2 % — AB (ref 4.0–5.6)

## 2024-01-14 MED ORDER — TRULICITY 1.5 MG/0.5ML ~~LOC~~ SOAJ: 1.5000 mg | SUBCUTANEOUS | 3 refills | Status: DC

## 2024-01-14 MED ORDER — TRULICITY 0.75 MG/0.5ML ~~LOC~~ SOAJ
0.7500 mg | SUBCUTANEOUS | 0 refills | Status: DC
Start: 2024-01-14 — End: 2024-02-13

## 2024-01-14 NOTE — Assessment & Plan Note (Signed)
 Encouraged healthy diet and lifestyle choices to affect sustainable weight loss Start GLP1RA as per above.

## 2024-01-14 NOTE — Progress Notes (Signed)
 Ph: [phone] Fax: [phone]   Patient ID: Jordan Reynolds, male    DOB: 08/10/1955, 68 y.o.   MRN: [id number]  This visit was conducted in person.  BP 118/80   Pulse 84   Temp 98.1 F (36.7 C) (Oral)   Ht 6' (1.829 m)   Wt (!) 349 lb 8 oz (158.5 kg)   SpO2 96%   BMI 47.40 kg/m    CC: 3 mo f/u visit , DM Subjective:   HPI: Jordan Reynolds is a 68 y.o. male presenting on 01/14/2024 for Medical Management of Chronic Issues (Pt here for 3 mo DM f/u and weighloss)   See prior note for details.  New onset DM based on A1c 6.5% (10/2023) - does not regularly check sugars. Compliant with antihyperglycemic regimen which includes: diet control. Denies low sugars or hypoglycemic symptoms. Denies paresthesias, blurry vision. Last diabetic eye exam - has not had. Glucometer brand: doesn't have one - declines glucometer. Last foot exam: today. DSME: offered - declined.  Lab Results  Component Value Date   HGBA1C 6.2 (A) 01/14/2024   Diabetic Foot Exam - Simple   Simple Foot Form Diabetic Foot exam was performed with the following findings: Yes 01/14/2024 11:00 AM  Visual Inspection See comments: Yes Sensation Testing See comments: Yes Pulse Check See comments: Yes Comments No claudication Mild maceration between toes Scaly soles bilaterally Diminished sensation to monofilament testing to left sole Diminished pulses bliaterally    No results found for: [place] CURRENT   He is very interested in medication assistance for weight loss.  He previously went to [place] weight [facility] clinic, he was following low carb low sugar diet with benefit.  No fmhx MTC or MEN2.      Relevant past medical, surgical, family and social history reviewed and updated as indicated. Interim medical history since our last visit reviewed. Allergies and medications reviewed and updated. Outpatient Medications Prior to Visit  Medication Sig Dispense Refill   amLODipine -benazepril  (LOTREL)  5-20 MG capsule Take 1 capsule by mouth daily. 90 capsule 3   Ascorbic Acid (VITAMIN C) 1000 MG tablet Take 1,000 mg by mouth daily.     atorvastatin  (LIPITOR) 20 MG tablet Take 1 tablet (20 mg total) by mouth daily. 90 tablet 3   Cyanocobalamin (VITAMIN B12 PO) Take by mouth daily.     MAGNESIUM PO Take by mouth daily.     sildenafil  (VIAGRA ) 100 MG tablet Take 0.5-1 tablets (50-100 mg total) by mouth daily as needed for erectile dysfunction. 5 tablet 6   No facility-administered medications prior to visit.     Per HPI unless specifically indicated in ROS section below Review of Systems  Objective:  BP 118/80   Pulse 84   Temp 98.1 F (36.7 C) (Oral)   Ht 6' (1.829 m)   Wt (!) 349 lb 8 oz (158.5 kg)   SpO2 96%   BMI 47.40 kg/m   Wt Readings from Last 3 Encounters:  01/14/24 (!) 349 lb 8 oz (158.5 kg)  10/24/23 (!) 342 lb 4 oz (155.2 kg)  05/29/22 (!) 350 lb 4 oz (158.9 kg)      Physical Exam Vitals and nursing note reviewed.  Constitutional:      Appearance: Normal appearance. He is not ill-appearing.  HENT:     Head: Normocephalic and atraumatic.     Mouth/Throat:     Mouth: Mucous membranes are moist.     Pharynx: Oropharynx is clear. No oropharyngeal exudate  or posterior oropharyngeal erythema.  Eyes:     Extraocular Movements: Extraocular movements intact.     Conjunctiva/sclera: Conjunctivae normal.     Pupils: Pupils are equal, round, and reactive to light.  Cardiovascular:     Rate and Rhythm: Normal rate and regular rhythm.     Pulses: Normal pulses.     Heart sounds: Normal heart sounds. No murmur heard. Pulmonary:     Effort: Pulmonary effort is normal. No respiratory distress.     Breath sounds: Normal breath sounds. No wheezing, rhonchi or rales.  Musculoskeletal:     Right lower leg: No edema.     Left lower leg: No edema.     Comments: See HPI for foot exam if done  Skin:    General: Skin is warm and dry.     Findings: No rash.  Neurological:      Mental Status: He is alert.  Psychiatric:        Mood and Affect: Mood normal.        Behavior: Behavior normal.       Results for orders placed or performed in visit on 01/14/24  POCT glycosylated hemoglobin (Hb A1C)   Collection Time: 01/14/24 10:41 AM  Result Value Ref Range   Hemoglobin A1C 6.2 (A) 4.0 - 5.6 %   HbA1c POC (<> result, manual entry)     HbA1c, POC (prediabetic range)     HbA1c, POC (controlled diabetic range)      Assessment & Plan:   Problem List Items Addressed This Visit     Type 2 diabetes mellitus with other specified complication (HCC) - Primary   Discussed diabetes diagnosis based on A1c 6.5% (10/2023).  Patient is interested in [place]. Reviewed mechanism of action of medication as well as side effects and adverse events to watch for including nausea, diarrhea, constipation, pancreatitis. No fmhx medullary thyroid  cancer or MEN2. Discussed titration schedule for medication. Reviewed importance of strength training to prevent muscle loss. Will start trulicity 0.75mg  weekly x 59mo then increase to 1.5mg  weekly.  Diabetes and nutrition handout and diabetes checklist provided.  Offered DSME - declined      Relevant Medications   Dulaglutide (TRULICITY) 0.75 MG/0.5ML SOAJ   Dulaglutide (TRULICITY) 1.5 MG/0.5ML SOAJ (Start on 02/11/2024)   Other Relevant Orders   POCT glycosylated hemoglobin (Hb A1C) (Completed)   Obesity, morbid, BMI 40.0-49.9 (HCC)   Encouraged healthy diet and lifestyle choices to affect sustainable weight loss Start GLP1RA as per above.       Relevant Medications   Dulaglutide (TRULICITY) 0.75 MG/0.5ML SOAJ   Dulaglutide (TRULICITY) 1.5 MG/0.5ML SOAJ (Start on 02/11/2024)     Meds ordered this encounter  Medications   Dulaglutide (TRULICITY) 0.75 MG/0.5ML SOAJ    Sig: Inject 0.75 mg into the skin once a week.    Dispense:  2 mL    Refill:  0   Dulaglutide (TRULICITY) 1.5 MG/0.5ML SOAJ    Sig: Inject 1.5 mg into the skin once a  week.    Dispense:  2 mL    Refill:  3    Orders Placed This Encounter  Procedures   POCT glycosylated hemoglobin (Hb A1C)    Patient Instructions  Flu shot today  We will price out weekly trulicity injection.  If insurance doesn't cover, we may have to first try metformin.  Next eye visit ask for diabetes eye exam.  Return in 3 months for diabetes follow up visit.   Follow up plan: Return  in about 3 months (around 04/15/2024) for follow up visit.  Anton Blas, MD

## 2024-01-14 NOTE — Patient Instructions (Addendum)
 Flu shot today  We will price out weekly trulicity injection.  If insurance doesn't cover, we may have to first try metformin.  Next eye visit ask for diabetes eye exam.  Return in 3 months for diabetes follow up visit.

## 2024-01-14 NOTE — Telephone Encounter (Signed)
 Clinical questions have been answered and PA submitted. PA currently Pending. Please be advised that most companies allow up to 30 days to make a decision. We will advise when a determination has been made, or follow up in 1 week.   Please reach out to our team, Rx Prior Auth Pool, if you haven't heard back in a week.

## 2024-01-14 NOTE — Telephone Encounter (Signed)
 Pharmacy Patient Advocate Encounter   Received notification from Onbase that prior authorization for Trulicity 0.75 is required/requested.   Insurance verification completed.   The patient is insured through [employer].   Per test claim: PA required; PA started via CoverMyMeds. KEY [id number] . Waiting for clinical questions to populate.

## 2024-01-14 NOTE — Assessment & Plan Note (Addendum)
 Discussed diabetes diagnosis based on A1c 6.5% (10/2023).  Patient is interested in [place]. Reviewed mechanism of action of medication as well as side effects and adverse events to watch for including nausea, diarrhea, constipation, pancreatitis. No fmhx medullary thyroid  cancer or MEN2. Discussed titration schedule for medication. Reviewed importance of strength training to prevent muscle loss. Will start trulicity 0.75mg  weekly x 41mo then increase to 1.5mg  weekly.  Diabetes and nutrition handout and diabetes checklist provided.  Offered DSME - declined

## 2024-01-14 NOTE — Telephone Encounter (Signed)
 Pharmacy Patient Advocate Encounter  Received notification from [employer] that Prior Authorization for Trulicity 0.75 has been APPROVED from 04/04/23 to 04/02/25. Ran test claim, Copay is $467.44. This test claim was processed through [facility]- copay amounts may vary at other pharmacies due to pharmacy/plan contracts, or as the patient moves through the different stages of their insurance plan.    PA #/Case ID/Reference #: # [id number]

## 2024-01-17 NOTE — Telephone Encounter (Signed)
 Looks like cost to him may be high for trulicity - has he had a chance to price out at [facility]?

## 2024-01-17 NOTE — Telephone Encounter (Signed)
 Left message to return call to our office.  Please provide  message from provider/office when call is returned from patient.

## 2024-01-18 NOTE — Telephone Encounter (Signed)
 Would suggest he go ahead and try the trulicity.

## 2024-01-18 NOTE — Telephone Encounter (Unsigned)
 Copied from CRM #[id number]. Topic: Clinical - Medication Prior Auth >> Jan 17, 2024  3:09 PM [place] E wrote: Reason for CRM: Patient returning call from the office. Relayed message from Dr. KANDICE, patient spoke with [facility] and stated that for the Trulicity 0.75 it would be $47, but for the 1.25 it would be $200+.

## 2024-01-21 NOTE — Telephone Encounter (Signed)
 Called patient reviewed all information and repeated back to me. Will call if any questions.  ? ?

## 2024-02-10 ENCOUNTER — Other Ambulatory Visit: Payer: Self-pay | Admitting: Family Medicine

## 2024-02-19 DIAGNOSIS — L570 Actinic keratosis: Secondary | ICD-10-CM | POA: Diagnosis not present

## 2024-02-19 DIAGNOSIS — D485 Neoplasm of uncertain behavior of skin: Secondary | ICD-10-CM | POA: Diagnosis not present

## 2024-02-19 DIAGNOSIS — L821 Other seborrheic keratosis: Secondary | ICD-10-CM | POA: Diagnosis not present

## 2024-03-12 LAB — OPHTHALMOLOGY REPORT-SCANNED

## 2024-04-15 ENCOUNTER — Encounter: Payer: Self-pay | Admitting: Family Medicine

## 2024-04-15 ENCOUNTER — Ambulatory Visit: Admitting: Family Medicine

## 2024-04-15 VITALS — BP 132/88 | HR 88 | Temp 98.1°F | Ht 72.0 in | Wt 350.6 lb

## 2024-04-15 DIAGNOSIS — E1169 Type 2 diabetes mellitus with other specified complication: Secondary | ICD-10-CM | POA: Diagnosis not present

## 2024-04-15 DIAGNOSIS — Z6841 Body Mass Index (BMI) 40.0 and over, adult: Secondary | ICD-10-CM

## 2024-04-15 DIAGNOSIS — Z7985 Long-term (current) use of injectable non-insulin antidiabetic drugs: Secondary | ICD-10-CM

## 2024-04-15 DIAGNOSIS — R454 Irritability and anger: Secondary | ICD-10-CM

## 2024-04-15 LAB — MICROALBUMIN / CREATININE URINE RATIO
Creatinine,U: 84.2 mg/dL
Microalb Creat Ratio: UNDETERMINED mg/g (ref 0.0–30.0)
Microalb, Ur: <0.7 mg/dL

## 2024-04-15 LAB — POCT GLYCOSYLATED HEMOGLOBIN (HGB A1C): Hemoglobin A1C: 6 % — AB (ref 4.0–5.6)

## 2024-04-15 NOTE — Assessment & Plan Note (Addendum)
 Great control to date on trulicity  however he stopped a few weeks ago due to increased cost in 2026.  He notes he may be able to get ozempic samples - reviewed titration schedule. Reassess control in 6 months

## 2024-04-15 NOTE — Progress Notes (Signed)
 " Ph: [phone] Fax: [phone]   Patient ID: Jordan Reynolds, male    DOB: 1956/01/13, 69 y.o.   MRN: [id number]  This visit was conducted in person.  BP 132/88 (BP Location: Left Arm, Patient Position: Sitting, Cuff Size: Large)   Pulse 88   Temp 98.1 F (36.7 C) (Oral)   Ht 6' (1.829 m)   Wt (!) 350 lb 9.6 oz (159 kg)   SpO2 95%   BMI 47.55 kg/m    CC: DM f/u visit  Subjective:   HPI: Jordan Reynolds is a 69 y.o. male presenting on 04/15/2024 for Medical Management of Chronic Issues (Pt doesn't check sugars at home, has frequent urination, some numbness in left hand that comes and goes /Does not limit anything within diet)   Notes more dietary liberties during the holiday season - lots of eating out.   DM - does not regularly check sugars. Compliant with antihyperglycemic regimen which includes: diet controlled. Trulicity  stopped due to cost increase to $600 first month. Denies low sugars or hypoglycemic symptoms. Denies paresthesias, blurry vision. Last diabetic eye exam 03/2024. Glucometer brand: doesn't have one - declined. Last foot exam: 01/2024. DSME: declined. Lab Results  Component Value Date   HGBA1C 6.0 (A) 04/15/2024   Diabetic Foot Exam - Simple   No data filed    No results found for: MACKEY CURRENT       Relevant past medical, surgical, family and social history reviewed and updated as indicated. Interim medical history since our last visit reviewed. Allergies and medications reviewed and updated. Outpatient Medications Prior to Visit  Medication Sig Dispense Refill   amLODipine -benazepril  (LOTREL) 5-20 MG capsule Take 1 capsule by mouth daily. 90 capsule 3   Ascorbic Acid (VITAMIN C) 1000 MG tablet Take 1,000 mg by mouth daily.     atorvastatin  (LIPITOR) 20 MG tablet Take 1 tablet (20 mg total) by mouth daily. 90 tablet 3   MAGNESIUM PO Take by mouth daily.     Semaglutide, 1 MG/DOSE, 4 MG/3ML SOPN Inject 0.25 mg as directed once a week,  THEN 0.5 mg once a week, THEN 1 mg once a week.     sildenafil  (VIAGRA ) 100 MG tablet Take 0.5-1 tablets (50-100 mg total) by mouth daily as needed for erectile dysfunction. 5 tablet 6   Cyanocobalamin (VITAMIN B12 PO) Take by mouth daily. (Patient not taking: Reported on 04/15/2024)     Dulaglutide  (TRULICITY ) 0.75 MG/0.5ML SOAJ INJECT 0.75 MG SUBCUTANEOUSLY ONE TIME PER WEEK (Patient not taking: Reported on 04/15/2024) 2 mL 5   Dulaglutide  (TRULICITY ) 1.5 MG/0.5ML SOAJ Inject 1.5 mg into the skin once a week. (Patient not taking: Reported on 04/15/2024) 2 mL 3   No facility-administered medications prior to visit.     Per HPI unless specifically indicated in ROS section below Review of Systems  Objective:  BP 132/88 (BP Location: Left Arm, Patient Position: Sitting, Cuff Size: Large)   Pulse 88   Temp 98.1 F (36.7 C) (Oral)   Ht 6' (1.829 m)   Wt (!) 350 lb 9.6 oz (159 kg)   SpO2 95%   BMI 47.55 kg/m   Wt Readings from Last 3 Encounters:  04/15/24 (!) 350 lb 9.6 oz (159 kg)  01/14/24 (!) 349 lb 8 oz (158.5 kg)  10/24/23 (!) 342 lb 4 oz (155.2 kg)      Physical Exam Vitals and nursing note reviewed.  Constitutional:      Appearance: Normal appearance. He  is not ill-appearing.  HENT:     Head: Normocephalic and atraumatic.     Mouth/Throat:     Mouth: Mucous membranes are moist.     Pharynx: Oropharynx is clear. No oropharyngeal exudate or posterior oropharyngeal erythema.  Eyes:     Extraocular Movements: Extraocular movements intact.     Conjunctiva/sclera: Conjunctivae normal.     Pupils: Pupils are equal, round, and reactive to light.  Cardiovascular:     Rate and Rhythm: Normal rate and regular rhythm.     Pulses: Normal pulses.     Heart sounds: Normal heart sounds. No murmur heard. Pulmonary:     Effort: Pulmonary effort is normal. No respiratory distress.     Breath sounds: Normal breath sounds. No wheezing, rhonchi or rales.  Musculoskeletal:     Right lower  leg: No edema.     Left lower leg: No edema.     Comments: See HPI for foot exam if done  Skin:    General: Skin is warm and dry.     Findings: No rash.  Neurological:     Mental Status: He is alert.  Psychiatric:        Mood and Affect: Mood normal.        Behavior: Behavior normal.       Results for orders placed or performed in visit on 04/15/24  HgB A1c   Collection Time: 04/15/24 10:25 AM  Result Value Ref Range   Hemoglobin A1C 6.0 (A) 4.0 - 5.6 %   HbA1c POC (<> result, manual entry)     HbA1c, POC (prediabetic range)     HbA1c, POC (controlled diabetic range)     No results found for: VITAMINB12  Lab Results  Component Value Date   CHOL 170 10/24/2023   HDL 63.10 10/24/2023   LDLCALC 89 10/24/2023   TRIG 86.0 10/24/2023   CHOLHDL 3 10/24/2023    Lab Results  Component Value Date   NA 141 10/24/2023   CL 102 10/24/2023   K 4.7 10/24/2023   CO2 29 10/24/2023   BUN 21 10/24/2023   CREATININE 0.88 10/24/2023   GFR 89.70 10/24/2023   CALCIUM  10.1 10/24/2023   ALBUMIN 4.8 10/24/2023   GLUCOSE 113 (H) 10/24/2023    Lab Results  Component Value Date   ALT 40 10/24/2023   AST 28 10/24/2023   ALKPHOS 45 10/24/2023   BILITOT 0.8 10/24/2023    Lab Results  Component Value Date   WBC 5.3 10/25/2023   HGB 13.9 10/25/2023   HCT 42.6 10/25/2023   MCV 88.3 10/25/2023   PLT 181.0 10/25/2023      04/15/2024   10:25 AM 01/14/2024   10:40 AM 10/24/2023    9:08 AM 05/29/2022   10:36 AM 12/18/2018    4:11 PM  Depression screen PHQ 2/9  Decreased Interest 1 0 1 2 0  Down, Depressed, Hopeless 1 1 1 1 1   PHQ - 2 Score 2 1 2 3 1   Altered sleeping 0 1 2 1    Tired, decreased energy 1 1 0 2   Change in appetite 3 1 1 1    Feeling bad or failure about yourself  1 1 0 1   Trouble concentrating 0 1 1 0   Moving slowly or fidgety/restless 0 0 1 0   Suicidal thoughts 0 0 0 0   PHQ-9 Score 7 6  7  8     Difficult doing work/chores Somewhat difficult Not difficult at all  Somewhat difficult Somewhat difficult      Data saved with a previous flowsheet row definition       04/15/2024   10:26 AM 01/14/2024   10:40 AM 10/24/2023    9:09 AM 05/29/2022   10:36 AM  GAD 7 : Generalized Anxiety Score  Nervous, Anxious, on Edge 0 1 2 1   Control/stop worrying 0 1 0 0  Worry too much - different things 0 1 1 1   Trouble relaxing 0 1 3 0  Restless 0 0 2 0  Easily annoyed or irritable 1 1 0 0  Afraid - awful might happen 0 0 0 0  Total GAD 7 Score 1 5 8 2   Anxiety Difficulty Not difficult at all Not difficult at all Somewhat difficult Somewhat difficult   Assessment & Plan:  Fibrosis 4 Score = 1.61 Fib-4 interpretation is not validated for people under 35 or over 53 years of age. However, scores under 2.0 are generally considered low risk.   Problem List Items Addressed This Visit     Type 2 diabetes mellitus with other specified complication (HCC) - Primary   Great control to date on trulicity  however he stopped a few weeks ago due to increased cost in 2026.  He notes he may be able to get ozempic samples - reviewed titration schedule. Reassess control in 6 months      Relevant Medications   Semaglutide, 1 MG/DOSE, 4 MG/3ML SOPN   Other Relevant Orders   HgB A1c (Completed)   Microalbumin / creatinine urine ratio   Obesity, morbid, BMI 40.0-49.9 (HCC)   Trulicity  became unaffordable.  Trial ozempic as per above.       Relevant Medications   Semaglutide, 1 MG/DOSE, 4 MG/3ML SOPN   Irritability   Ongoing irritability noted in setting of marital stressors.  He declines mood medication at this time.  Encouraged finding areas of compromise with wife        No orders of the defined types were placed in this encounter.   Orders Placed This Encounter  Procedures   Microalbumin / creatinine urine ratio   HgB A1c    Patient Instructions  If you can get ozempic, start at 0.25mg  weekly for 1 month then increase to 0.5mg  weekly for 1 month then up to  1mg  weekly from then on if tolerated well, let us  know if any trouble with titration or tolerating ozempic.  Chair exercises and diabetes diet handouts provided today  Urine protein test today  Good to see you today.  Return in 6 months for physical/wellness visit (after 10/23/2024).   Follow up plan: Return in about 6 months (around 10/23/2024) for annual exam, prior fasting for blood work, medicare wellness visit.  Anton Blas, MD   "

## 2024-04-15 NOTE — Assessment & Plan Note (Addendum)
 Ongoing irritability noted in setting of marital stressors.  He declines mood medication at this time.  Encouraged finding areas of compromise with wife

## 2024-04-15 NOTE — Assessment & Plan Note (Signed)
 Trulicity  became unaffordable.  Trial ozempic as per above.

## 2024-04-15 NOTE — Patient Instructions (Addendum)
 If you can get ozempic, start at 0.25mg  weekly for 1 month then increase to 0.5mg  weekly for 1 month then up to 1mg  weekly from then on if tolerated well, let us  know if any trouble with titration or tolerating ozempic.  Chair exercises and diabetes diet handouts provided today  Urine protein test today  Good to see you today.  Return in 6 months for physical/wellness visit (after 10/23/2024).

## 2024-04-16 ENCOUNTER — Ambulatory Visit: Payer: Self-pay | Admitting: Family Medicine

## 2024-10-22 ENCOUNTER — Other Ambulatory Visit

## 2024-10-29 ENCOUNTER — Encounter: Admitting: Family Medicine
